# Patient Record
Sex: Male | Born: 2000
Health system: Southern US, Community
[De-identification: ages and names within clinical notes are randomized; demographics above are authoritative.]

## PROBLEM LIST (undated history)

## (undated) DIAGNOSIS — R7309 Other abnormal glucose: Secondary | ICD-10-CM

## (undated) DIAGNOSIS — E119 Type 2 diabetes mellitus without complications: Secondary | ICD-10-CM

## (undated) HISTORY — PX: APPENDECTOMY: SHX54

## (undated) HISTORY — DX: Other abnormal glucose: R73.09

---
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
## 2021-01-24 ENCOUNTER — Other Ambulatory Visit (INDEPENDENT_AMBULATORY_CARE_PROVIDER_SITE_OTHER): Payer: Self-pay | Admitting: Pediatrics

## 2021-01-26 ENCOUNTER — Encounter (INDEPENDENT_AMBULATORY_CARE_PROVIDER_SITE_OTHER): Payer: Self-pay

## 2021-01-26 DIAGNOSIS — E109 Type 1 diabetes mellitus without complications: Secondary | ICD-10-CM

## 2021-01-27 ENCOUNTER — Encounter (INDEPENDENT_AMBULATORY_CARE_PROVIDER_SITE_OTHER): Payer: Self-pay | Admitting: Pediatrics

## 2021-01-27 MED ORDER — FREESTYLE LITE TEST VI STRP: ORAL_STRIP | 1 refills | Status: AC

## 2021-01-27 MED ORDER — INSULIN LISPRO (1 UNIT DIAL) 100 UNIT/ML (KWIKPEN): PEN_INJECTOR | SUBCUTANEOUS | 1 refills | Status: DC

## 2021-01-27 MED ORDER — BASAGLAR KWIKPEN 100 UNIT/ML ~~LOC~~ SOPN: PEN_INJECTOR | SUBCUTANEOUS | 1 refills | Status: DC

## 2021-01-29 ENCOUNTER — Other Ambulatory Visit (INDEPENDENT_AMBULATORY_CARE_PROVIDER_SITE_OTHER): Payer: Self-pay | Admitting: Pediatrics

## 2021-01-29 DIAGNOSIS — E1065 Type 1 diabetes mellitus with hyperglycemia: Secondary | ICD-10-CM

## 2021-01-30 ENCOUNTER — Encounter (INDEPENDENT_AMBULATORY_CARE_PROVIDER_SITE_OTHER): Payer: Self-pay

## 2021-01-30 ENCOUNTER — Other Ambulatory Visit (INDEPENDENT_AMBULATORY_CARE_PROVIDER_SITE_OTHER): Payer: Self-pay

## 2021-01-30 DIAGNOSIS — R7309 Other abnormal glucose: Secondary | ICD-10-CM

## 2021-01-30 DIAGNOSIS — E1065 Type 1 diabetes mellitus with hyperglycemia: Secondary | ICD-10-CM

## 2021-01-30 MED ORDER — INSUPEN PEN NEEDLES 32G X 4 MM MISC: 3 refills | Status: DC

## 2021-01-30 MED ORDER — DEXCOM G6 SENSOR MISC: 1 refills | Status: DC

## 2021-01-30 NOTE — Telephone Encounter (Signed)
Mom called this morning she has sent a My chart Message and we have an E-Scribe from the pharmacy but pharmacy asked her to call and make sure we get this sent over right away. Patient is leaving to go out of town on Monday and mom needs to pick up prescription today please advise

## 2021-03-04 ENCOUNTER — Encounter (INDEPENDENT_AMBULATORY_CARE_PROVIDER_SITE_OTHER): Payer: Self-pay | Admitting: Pediatrics

## 2021-03-04 ENCOUNTER — Other Ambulatory Visit: Payer: Self-pay

## 2021-03-04 ENCOUNTER — Telehealth (INDEPENDENT_AMBULATORY_CARE_PROVIDER_SITE_OTHER): Payer: 59 | Admitting: Pediatrics

## 2021-03-04 DIAGNOSIS — Z4681 Encounter for fitting and adjustment of insulin pump: Secondary | ICD-10-CM | POA: Diagnosis not present

## 2021-03-04 DIAGNOSIS — E109 Type 1 diabetes mellitus without complications: Secondary | ICD-10-CM | POA: Diagnosis not present

## 2021-03-04 NOTE — Patient Instructions (Signed)
It was a pleasure to see you in clinic today.   Feel free to contact our office during normal business hours at [phone] with questions or concerns. If you need us urgently after normal business hours, please call the above number to reach our answering service who will contact the on-call pediatric endocrinologist.  If you choose to communicate with us via MyChart, please do not send urgent messages as this inbox is NOT monitored on nights or weekends.  Urgent concerns should be discussed with the on-call pediatric endocrinologist.  -Always have fast sugar with you in case of low blood sugar (glucose tabs, regular juice or soda, candy) -Always wear your ID that states you have diabetes -Always bring your meter/continuous glucose monitor to your visit -Call/Email if you want to review blood sugars   At [facility], we are committed to providing exceptional care. You will receive a patient satisfaction survey through text or email regarding your visit today. Your opinion is important to me. Comments are appreciated.  

## 2021-03-04 NOTE — Progress Notes (Signed)
This is a [facility] E-Visit follow up consult provided via [facility]  and his mother consented to an E-Visit consult today.  Location of patient: Luke [name] is at home Location of provider: Lanelle Bal is at office Patient was referred by Naida Sleight, MD   The following participants were involved in this E-Visit: Luke [name]- patient, Jerelene Redden, MD- provider- Eustace Moore, CCMA, Jaece's mother  This visit was done via [employer] Complain/ Reason for E-Visit today: follow up T1DM Total time on call: 30 minutes (majority of visit conducted via video however video froze so last 10 minutes conducted via phone Follow up: 3 months   Pediatric Endocrinology Consultation Follow-up Visit  Chief Complaint: Type 1 diabetes  HPI: Luke [name] is a 20 y.o. male presenting for follow-up of the above concerns.  he is accompanied to this visit by his mother.   THIS IS A TELEHEALTH VIDEO VISIT.   1. Tennis was initially referred to [facility] in 05/2015 for concerns of new onset diabetes.  He was seen by his PCP on 06/10/2015 for a well child check.  At that visit, blood work was obtained due to him being overweight including a random blood sugar that was elevated to 297 (this was obtained 3-4 hours after eating a cheeseburger, potato wedges, apple slices, chocolate milk, a can of pineapple soda, and a cupcake with frosting).  A lipid panel was also obtained 06/10/15 showing total cholesterol 199, HDL 31, LDL 109.  He was called back to PCP's office on 06/11/2015 for fasting blood work (fingerstick glucose 155, 146 on CMP), UA (negative for ketones and glucose), A1c 7.7%.  CMP normal except elevated glucose (including normal BUN /Cr and AST/ALT).  TSH slightly elevated at 5.349 (0.4-5) with normal FT4 of 0.93.  He was initially seen at [facility] on 06/13/15 where A1c was 7.4%, GAD Ab and insulin Ab were positive and C-peptide was normal and he was diagnosed with evolving  T1DM. He was started on metformin at that visit prior to Ab results.  Metformin was discontinued in 07/2015 after several blood sugars were in the 60s.  He also had normal TFTs and celiac screen in 07/2015.  A1c gradually increased and blood sugars started becoming elevated so he was started on rapid-acting insulin in 10/2016 and started on long-acting insulin within the next week.  He started on an omnipod pump in 02/2017.  He started on a dexcom CGM in 07/2018.  2. Since last visit on 11/06/20, he has been well.   ED visits/Hospitalizations for diabetes: None  Concerns:  Went to [place] and [place], BGs low due to lots of walking.  Has been back x 2 days though not feeling well so visit today is virtual.  Insulin regimen: Humalog in omnipod pump Basal Rates:  12AM 1.55  8AM 1.3  12PM 1.1  2PM 1.05  6PM 1.4  Total: 32.4 units daily Confirmed basal rates with patient.   Insulin to carbohydrate ratio 12AM 7  6AM 5  10AM 6  2PM 7    Insulin Sensitivity Factor 12AM 30                        Target Blood Glucose 12AM 140  6:30AM 115  9PM 140            Active insulin time 3 hours  BG/Pump download:  Unable to download given virtual visit   CGM download:  Hypoglycemia: able to feel lows. Many while walking. No glucagon needed recently.  Wearing Med-alert ID currently: Not discussed today Injection sites: abdominal wall and arm(s).No problems with site infections Annual labs due: 04/2021 Ophthalmology due: last eye exam 12/2020; no concerns per mom.  ROS: All systems reviewed with pertinent positives listed below; otherwise negative. Video visit as he is not feeling well, feels BGs elevated also due to illness    Past Medical History:  Past Medical History:  Diagnosis Date   Diabetes mellitus without complication ([place])    Elevated hemoglobin A1c    05/2015 A1c 7.7% with fasting hyperglycemia.  + GAD ab and + insulin Ab    Meds: Current Outpatient  Medications on File Prior to Visit  Medication Sig Dispense Refill   Continuous Blood Gluc Sensor (DEXCOM G6 SENSOR) MISC Change every 10 days as directed. 9 each 1   Continuous Blood Gluc Transmit (DEXCOM G6 TRANSMITTER) MISC USE AS DIRECTED 1 each 3   insulin lispro (HUMALOG) 100 UNIT/ML injection 200 UNITS OF INSULIN IN PUMP EVERY 48 HOURS 40 mL 5   Glucagon (BAQSIMI TWO PACK) 3 MG/DOSE POWD Place 1 application into the nose as needed. Use as directed if unconscious, unable to take food po, or having a seizure due to hypoglycemia (Patient not taking: Reported on 03/04/2021) 2 each 1   glucagon 1 MG injection Use for Severe Hypoglycemia . Inject 96m intramuscularly if unresponsive, unable to swallow, unconscious and/or has seizure (Patient not taking: Reported on 11/06/2020) 1 kit 2   glucose blood (FREESTYLE LITE) test strip Use to check blood sugar 6x per day 600 strip 1   Insulin Glargine (BASAGLAR KWIKPEN) 100 UNIT/ML Inject up to 50 units per day 45 mL 1   insulin lispro (HUMALOG KWIKPEN) 100 UNIT/ML KwikPen Inject up to 50 units per day 45 mL 1   Insulin Pen Needle (INSUPEN PEN NEEDLES) 32G X 4 MM MISC Inject insulin via insulin pen 6 x daily 200 each 3   triamcinolone (KENALOG) 0.025 % cream Apply 1 application topically 2 (two) times daily. (Patient not taking: No sig reported) 30 g 0   No current facility-administered medications on file prior to visit.   Allergies: No Known Allergies  Surgical History: Past Surgical History:  Procedure Laterality Date   APPENDECTOMY     LAPAROSCOPIC APPENDECTOMY N/A 12/16/2013   Procedure: APPENDECTOMY LAPAROSCOPIC;  Surgeon: MJerilynn Mages SGerald Stabs MD;  Location[facility]  Service: Pediatrics;  Laterality: N/A;   Family History:  Family History  Problem Relation Age of Onset   Kidney disease Paternal Grandmother    Kidney disease Paternal Grandfather    Hypertension Maternal Grandmother    Thyroid disease Maternal Grandfather    Healthy Mother     No strong family history of T2DM; only known family member with T2DM is paternal great grandmother.  No family history of T1DM.  MGF has hyperthyroidism, treated with oral medication (has never had surgery).  No other autoimmune diseases in the family.  Social History: Lives with: parents.  He is an only child Graduated from [employer]  [facility] [employer] [facility] [employer].    Physical Exam:  There were no vitals filed for this visit.  Body mass index: body mass index is unknown because there is no height or weight on file. Blood pressure percentiles are not available for patients who are 18 years or older.   Wt Readings from Last 3 Encounters:  11/06/20 203 lb (92.1 kg) (  95 %, Z= 1.61)*  08/05/20 191 lb (86.6 kg) (91 %, Z= 1.36)*  05/07/20 195 lb 1.7 oz (88.5 kg) (93 %, Z= 1.49)*   * Growth percentiles are based on CDC (Boys, 2-20 Years) data.   Ht Readings from Last 3 Encounters:  08/05/20 5' 10.47" (1.79 m) (65 %, Z= 0.39)*  04/30/20 5' 9.92" (1.776 m) (58 %, Z= 0.21)*  11/14/19 5' 9.49" (1.765 m) (54 %, Z= 0.11)*   * Growth percentiles are based on CDC (Boys, 2-20 Years) data.   There is no height or weight on file to calculate BMI.  No weight on file for this encounter. No height on file for this encounter.   Exam limited due to video visit.  Alert, well appearing, answers questions appropriately  Laboratory Evaluation:  Last A1c 5.1% in 10/2015--> 5.4% in 12/2015-->5.7%-->6.0%-->6.8% 10/28/16-->8.2% 01/2017-->7.8% in 03/2017-->7.6% 08/2017-->8.8% in 11/2017-->8.6% 02/2018-->9.2% 06/2018--> 8.4% 09/2018--> 7.8% 03/2019--> 8.4% 07/2019--> 8.6% 10/2019--> 8% 03/2020--> 8.6% 07/2020--> 7.4% 10/2020  03/2016 TFTs: TSH 3.654, FT4 0.9.  Thyroglobulin Ab at 1.3 (<1) and TPO Ab 71 (<9).    Assessment/Plan: Tate Zagal is a 20 y.o. male with T1DM on a pump (omnipod original) and CGM regimen.   Unable to do A1c due to virtual visit, though Dexcom tracing shows he is not  meeting goal of TIR >70%. he needs more insulin via basal throughout the day.    When a patient is on insulin, intensive monitoring of blood glucose levels and continuous insulin titration is vital to avoid insulin toxicity leading to severe hypoglycemia. Severe hypoglycemia can lead to seizure or death. Hyperglycemia can also result from inadequate insulin dosing and can lead to ketosis requiring ICU admission and intravenous insulin.   1. Type 1 without complications ([place]) -Encouraged to wear med alert ID every day, esp when at college -Provided with my contact information and advised to email/send mychart with questions/need for BG review -CGM download reviewed extensively (see interpretation above) -Discussed omnipod 5; he is interested.  Advised to go to [employer] and request upgrade to omnipod 5.  I will route his information to our DM educator and nurse as well so they can follow-up on this.  2. Insulin pump titration -Made the following pump changes: Basal Rates:  12AM 1.55  8AM 1.3  12PM 1.1-->1.2  2PM 1.05-->1.15  6PM 1.4  Total: 32.4 units daily   Insulin to carbohydrate ratio 12AM 7  6AM 5  10AM 6  2PM 7    Insulin Sensitivity Factor 12AM 30                        Target Blood Glucose 12AM 140  6:30AM 115  9PM 140            Active insulin time 3 hours  -He would greatly benefit from closed loop pump for fine-tuning. -Discussed DM and college including roommate knowing how to administer glucagon, alcohol and diabetes.  Follow-up:   Return in about 3 months (around 06/04/2021).   >40 minutes spent today reviewing the medical chart, counseling the patient/family, and documenting today's encounter.   [name], MD

## 2021-03-26 ENCOUNTER — Encounter (INDEPENDENT_AMBULATORY_CARE_PROVIDER_SITE_OTHER): Payer: Self-pay

## 2021-04-01 ENCOUNTER — Encounter (INDEPENDENT_AMBULATORY_CARE_PROVIDER_SITE_OTHER): Payer: Self-pay | Admitting: Pediatrics

## 2021-04-27 ENCOUNTER — Encounter (INDEPENDENT_AMBULATORY_CARE_PROVIDER_SITE_OTHER): Payer: Self-pay

## 2021-04-27 DIAGNOSIS — E1065 Type 1 diabetes mellitus with hyperglycemia: Secondary | ICD-10-CM

## 2021-04-27 MED ORDER — DEXCOM G6 TRANSMITTER MISC: 1 refills | Status: DC

## 2021-05-01 ENCOUNTER — Encounter (INDEPENDENT_AMBULATORY_CARE_PROVIDER_SITE_OTHER): Payer: Self-pay

## 2021-05-18 ENCOUNTER — Encounter (INDEPENDENT_AMBULATORY_CARE_PROVIDER_SITE_OTHER): Payer: Self-pay

## 2021-07-16 ENCOUNTER — Other Ambulatory Visit (INDEPENDENT_AMBULATORY_CARE_PROVIDER_SITE_OTHER): Payer: Self-pay | Admitting: Pediatrics

## 2021-07-16 DIAGNOSIS — E1065 Type 1 diabetes mellitus with hyperglycemia: Secondary | ICD-10-CM

## 2021-07-28 ENCOUNTER — Telehealth (INDEPENDENT_AMBULATORY_CARE_PROVIDER_SITE_OTHER): Payer: Self-pay

## 2021-07-28 NOTE — Telephone Encounter (Signed)
Per covermymeds, initiated PA for Dexcom Sensor thru covermymeds:

## 2021-09-04 ENCOUNTER — Other Ambulatory Visit: Payer: Self-pay

## 2021-09-04 ENCOUNTER — Ambulatory Visit (HOSPITAL_COMMUNITY)
Admission: RE | Admit: 2021-09-04 | Discharge: 2021-09-04 | Disposition: A | Discharge status: Home / Self Care | Payer: 59 | Source: Ambulatory Visit | Attending: Emergency Medicine | Admitting: Emergency Medicine

## 2021-09-04 VITALS — BP 146/83 | HR 78 | Temp 98.3°F | Resp 16

## 2021-09-04 DIAGNOSIS — H938X1 Other specified disorders of right ear: Secondary | ICD-10-CM

## 2021-09-04 MED ORDER — NEOMYCIN-POLYMYXIN-HC 3.5-10000-1 OT SUSP: 4.0000 [drp] | Freq: Three times a day (TID) | OTIC | 0 refills | Status: DC

## 2021-09-04 NOTE — Discharge Instructions (Addendum)
Place 4 drops into right ear three times a day for 7 days  If no improvement seen in 72 hours please return for evaluation  Refrain from placing objects into ear until symptoms have resolved   May use over the counter flonase nasal spray to help remove secretions and minimize sensation of fullness

## 2021-09-04 NOTE — ED Provider Notes (Signed)
[place]    CSN: [id number] Arrival date & time: 09/04/21  1747      History   Chief Complaint Chief Complaint  Patient presents with   Otalgia    HPI Luke [name] is a 21 y.o. male.   Patient presents with right-sided ear pain and sensation of fullness for 4 days.  After sleeping directly on right side.  Has begun to have muffled hearing as well.  Has attempted use of ear wax removal kit and irrigation with water at home which was not effective.  Denies fever, chills, drainage, ear itching, URI symptoms.    Past Medical History:  Diagnosis Date   Diabetes mellitus without complication (HCC)    Elevated hemoglobin A1c    05/2015 A1c 7.7% with fasting hyperglycemia.  + GAD ab and + insulin Ab    Patient Active Problem List   Diagnosis Date Noted   Need for vaccination 81/05/3158   DM w/o complication type I, uncontrolled 09/05/2017   Encounter for immunization 09/05/2017   Abnormal endocrine laboratory test finding 09/05/2017   Elevated hemoglobin A1c 07/02/2015   Systemic inflammatory response syndrome due to infection 12/17/2013   Acute cecitis with rupture of appendix 12/16/2013    Past Surgical History:  Procedure Laterality Date   APPENDECTOMY     LAPAROSCOPIC APPENDECTOMY N/A 12/16/2013   Procedure: APPENDECTOMY LAPAROSCOPIC;  Surgeon: Jerilynn Mages. Gerald Stabs, MD;  Location: [facility];  Service: Pediatrics;  Laterality: N/A;       Home Medications    Prior to Admission medications   Medication Sig Start Date End Date Taking? Authorizing Provider  Continuous Blood Gluc Sensor (DEXCOM G6 SENSOR) MISC Change every 10 days as directed. 01/30/21   [name], MD  Continuous Blood Gluc Transmit (DEXCOM G6 TRANSMITTER) MISC Use with Dexcom sensor, reuse for 3 months 04/27/21   [name], MD  Glucagon (BAQSIMI TWO PACK) 3 MG/DOSE POWD Place 1 application into the nose as needed. Use as directed if unconscious, unable to take food po, or  having a seizure due to hypoglycemia Patient not taking: Reported on 03/04/2021 04/30/20   [name], MD  glucagon 1 MG injection Use for Severe Hypoglycemia . Inject 33m intramuscularly if unresponsive, unable to swallow, unconscious and/or has seizure Patient not taking: Reported on 11/06/2020 12/08/17   JLevon Hedger MD  glucose blood (FREESTYLE LITE) test strip Use to check blood sugar 6x per day 01/27/21   JLevon Hedger MD  Insulin Glargine ([facility] 100 UNIT/ML Inject up to 50 units per day 01/27/21   JLevon Hedger MD  insulin lispro (HUMALOG KWIKPEN) 100 UNIT/ML KwikPen Inject up to 50 units per day 01/27/21   JLevon Hedger MD  insulin lispro (HUMALOG) 100 UNIT/ML injection 200 UNITS OF INSULIN IN PUMP EVERY 48 HOURS 07/16/21   JLevon Hedger MD  Insulin Pen Needle (INSUPEN PEN NEEDLES) 32G X 4 MM MISC Inject insulin via insulin pen 6 x daily 01/30/21   JLevon Hedger MD  triamcinolone (KENALOG) 0.025 % cream Apply 1 application topically 2 (two) times daily. Patient not taking: No sig reported 02/26/20   MEnrique Sack FNP    Family History Family History  Problem Relation Age of Onset   Kidney disease Paternal Grandmother    Kidney disease Paternal Grandfather    Hypertension Maternal Grandmother    Thyroid disease Maternal Grandfather    Healthy Mother     Social History Social History  Tobacco Use   Smoking status: Never   Smokeless tobacco: Never  Substance Use Topics   Alcohol use: No   Drug use: No     Allergies   Patient has no known allergies.   Review of Systems Review of Systems  Constitutional: Negative.   HENT:  Positive for ear pain. Negative for congestion, dental problem, drooling, ear discharge, facial swelling, hearing loss, mouth sores, nosebleeds, postnasal drip, rhinorrhea, sinus pressure, sinus pain, sneezing, sore throat, tinnitus, trouble swallowing and voice  change.   Respiratory: Negative.    Cardiovascular: Negative.   Skin: Negative.   Neurological: Negative.     Physical Exam Triage Vital Signs ED Triage Vitals [09/04/21 1803]  Enc Vitals Group     BP (!) 146/83     Pulse Rate 78     Resp 16     Temp 98.3 F (36.8 C)     Temp Source Oral     SpO2 100 %     Weight      Height      Head Circumference      Peak Flow      Pain Score      Pain Loc      Pain Edu?      Excl. in [place]?    No data found.  Updated Vital Signs BP (!) 146/83 (BP Location: Left Arm)    Pulse 78    Temp 98.3 F (36.8 C) (Oral)    Resp 16    SpO2 100%   Visual Acuity Right Eye Distance:   Left Eye Distance:   Bilateral Distance:    Right Eye Near:   Left Eye Near:    Bilateral Near:     Physical Exam Constitutional:      Appearance: Normal appearance. He is normal weight.  HENT:     Head: Normocephalic.     Right Ear: Tympanic membrane and external ear normal.     Left Ear: Tympanic membrane, ear canal and external ear normal.     Ears:     Comments: Hailie Searight to yellowish drainage noted in the right ear canal, no involvement of the tympanic membrane or external ear Eyes:     Extraocular Movements: Extraocular movements intact.  Pulmonary:     Effort: Pulmonary effort is normal.  Skin:    General: Skin is warm and dry.  Neurological:     Mental Status: He is alert and oriented to person, place, and time. Mental status is at baseline.  Psychiatric:        Mood and Affect: Mood normal.        Behavior: Behavior normal.     UC Treatments / Results  Labs (all labs ordered are listed, but only abnormal results are displayed) Labs Reviewed - No data to display  EKG   Radiology No results found.  Procedures Procedures (including critical care time)  Medications Ordered in UC Medications - No data to display  Initial Impression / Assessment and Plan / UC Course  I have reviewed the triage vital signs and the nursing  notes.  Pertinent labs & imaging results that were available during my care of the patient were reviewed by me and considered in my medical decision making (see chart for details).  Ear fullness, right  Attempted ear irrigation in office with no success, ear reevaluated by Dr. Windy Carina, prescribed Cortisporin otic suspension, 4 drops 3 times a day for 7 days, recommended reevaluation if symptoms have not improved  in the 72-hour period, advise discontinuation of further irrigation or object placement into ear canal until symptoms have resolved, may attempt use of over-the-counter Flonase to further help clear sinuses Final Clinical Impressions(s) / UC Diagnoses   Final diagnoses:  None   Discharge Instructions   None    ED Prescriptions   None    PDMP not reviewed this encounter.   Hans Eden, NP 09/07/21 1325

## 2021-09-24 ENCOUNTER — Telehealth (INDEPENDENT_AMBULATORY_CARE_PROVIDER_SITE_OTHER): Payer: Self-pay | Admitting: Pharmacist

## 2021-09-24 ENCOUNTER — Other Ambulatory Visit (HOSPITAL_COMMUNITY): Payer: Self-pay

## 2021-09-24 ENCOUNTER — Ambulatory Visit (INDEPENDENT_AMBULATORY_CARE_PROVIDER_SITE_OTHER): Payer: 59 | Admitting: Pediatrics

## 2021-09-24 ENCOUNTER — Other Ambulatory Visit: Payer: Self-pay

## 2021-09-24 ENCOUNTER — Encounter (INDEPENDENT_AMBULATORY_CARE_PROVIDER_SITE_OTHER): Payer: Self-pay | Admitting: Pediatrics

## 2021-09-24 ENCOUNTER — Telehealth (INDEPENDENT_AMBULATORY_CARE_PROVIDER_SITE_OTHER): Payer: Self-pay

## 2021-09-24 VITALS — BP 128/80 | HR 76 | Wt 218.4 lb

## 2021-09-24 DIAGNOSIS — E1065 Type 1 diabetes mellitus with hyperglycemia: Secondary | ICD-10-CM | POA: Diagnosis not present

## 2021-09-24 DIAGNOSIS — Z4681 Encounter for fitting and adjustment of insulin pump: Secondary | ICD-10-CM

## 2021-09-24 DIAGNOSIS — E10649 Type 1 diabetes mellitus with hypoglycemia without coma: Secondary | ICD-10-CM

## 2021-09-24 LAB — POCT GLYCOSYLATED HEMOGLOBIN (HGB A1C): HbA1c POC (<> result, manual entry): 7.8 % (ref 4.0–5.6)

## 2021-09-24 LAB — POCT GLUCOSE (DEVICE FOR HOME USE): Glucose Fasting, POC: 231 mg/dL — AB (ref 70–99)

## 2021-09-24 MED ORDER — INSULIN LISPRO 100 UNIT/ML IJ SOLN: INTRAMUSCULAR | 5 refills | Status: DC

## 2021-09-24 NOTE — Patient Instructions (Addendum)
It was a pleasure to see you in clinic today.   Feel free to contact our office during normal business hours at [phone] with questions or concerns. If you need Luke [name] urgently after normal business hours, please call the above number to reach our answering service who will contact the on-call pediatric endocrinologist.  If you choose to communicate with Luke [name] via [place], please do not send urgent messages as this inbox is NOT monitored on nights or weekends.  Urgent concerns should be discussed with the on-call pediatric endocrinologist.  -Always have fast sugar with you in case of low blood sugar (glucose tabs, regular juice or soda, candy) -Always wear your ID that states you have diabetes -Always bring your meter/continuous glucose monitor to your visit -Call/Email if you want to review blood sugars  Please go to the following address to have labs drawn after today's visit: [address] [place], [place] [address]  Or   [address], [address]  Basaglar dose is 33 units every 24 hours Humalog carbs = total number of carbs divided by 6 Humalog correction= (Blood sugar-150) divided by 30

## 2021-09-24 NOTE — Telephone Encounter (Signed)
Please contact family to discuss   Omnipod 5 G6 Intro Kit (1 kit, 30 day supply; no prior authorization required): $35 for 30 day supply   Omnipod 5 G6 Pods (3 boxes (each box has 5 pods; no prior authorization required), 30 day supply): $35 for 30 day supply   Please discuss . [id number]  Available Pump Date Classes: -10/01/21 8:30 am - 11:30 am -10/08/21 8:30 am - 11:30 am -10/22/21 8:30 am - 11:30 am  Thank you for involving clinical pharmacist/diabetes educator to assist in providing this patient's care.   Drexel Iha, PharmD, BCACP, [place], CPP

## 2021-09-24 NOTE — Telephone Encounter (Signed)
Please run benefits investigation for Omnipod 5 device. This is not a specialty medication and can be filled at the local pharmacy. °  °Omnipod 5 G6 Intro Kit (1 kit, 30 day supply), NDC [address]-01 °  °Omnipod 5 G6 Pods (3 boxes (each box has 5 pods), 30 day supply), NDC [address]-21 °  °Can you also please let me know °1) if PA is required? °  °Thank you for involving clinical pharmacist/diabetes educator to assist in providing this patient's care.  °  °Ferrah Panagopoulos, PharmD, BCACP, CDCES, CPP ° °

## 2021-09-24 NOTE — Progress Notes (Addendum)
Pediatric Endocrinology Consultation Follow-up Visit  Chief Complaint: Type 1 diabetes  HPI: Luke [name] is a 21 y.o. male presenting for follow-up of the above concerns.  he is accompanied to this visit by his mother.      1. Luke [name] was initially referred to [facility] in 05/2015 for concerns of new onset diabetes.  He was seen by his PCP on 06/10/2015 for a well child check.  At that visit, blood work was obtained due to him being overweight including a random blood sugar that was elevated to 297 (this was obtained 3-4 hours after eating a cheeseburger, potato wedges, apple slices, chocolate milk, a can of pineapple soda, and a cupcake with frosting).  A lipid panel was also obtained 06/10/15 showing total cholesterol 199, HDL 31, LDL 109.  He was called back to PCP's office on 06/11/2015 for fasting blood work (fingerstick glucose 155, 146 on CMP), UA (negative for ketones and glucose), A1c 7.7%.  CMP normal except elevated glucose (including normal BUN /Cr and AST/ALT).  TSH slightly elevated at 5.349 (0.4-5) with normal FT4 of 0.93.  He was initially seen at [facility] on 06/13/15 where A1c was 7.4%, GAD Ab and insulin Ab were positive and C-peptide was normal and he was diagnosed with evolving T1DM. He was started on metformin at that visit prior to Ab results.  Metformin was discontinued in 07/2015 after several blood sugars were in the 60s.  He also had normal TFTs and celiac screen in 07/2015.  A1c gradually increased and blood sugars started becoming elevated so he was started on rapid-acting insulin in 10/2016 and started on long-acting insulin within the next week.  He started on an omnipod pump in 02/2017.  He started on a dexcom CGM in 07/2018.  2. Since last visit on 03/04/21 (video visit), he has been well.   ED visits/Hospitalizations for diabetes: None  Concerns:  Wants omnipod 5 though insurance did not cover it when we tried in the past.   Needs name of adult ophthalmologist Planning to  study abroad Jan 2024  Insulin regimen: Humalog in omnipod pump Basal Rates:  12AM 1.55  8AM 1.3  12PM 1.2  2PM 1.15  6PM 1.4  Total: 33 units daily   Insulin to carbohydrate ratio 12AM 7  6AM 5  10AM 6  2PM 7    Insulin Sensitivity Factor 12AM 30                        Target Blood Glucose 12AM 140  6:30AM 115  9AM 140            Active insulin time 3 hours   BG/Pump download:     CGM download:      Interpretation: Needs more basal throughout the day and less at night   Hypoglycemia: Does have in middle of the night lows.  Able to feel lows.  No glucagon needed recently.  Wearing Med-alert ID currently: Not currently.  Injection sites: abdominal wall and arm(s), no site infections Annual labs due: 04/2021 Ophthalmology due: last eye exam 12/2020; no concerns per mom.  Needs adult ophthalmologist.  ROS: All systems reviewed with pertinent positives listed below; otherwise negative. Constitutional: Weight has increased 15lb since last visit.       Past Medical History:  Past Medical History:  Diagnosis Date   Diabetes mellitus without complication ([place])    Elevated hemoglobin A1c    05/2015 A1c 7.7% with fasting hyperglycemia.  + GAD  ab and + insulin Ab    Meds: Current Outpatient Medications on File Prior to Visit  Medication Sig Dispense Refill   Continuous Blood Gluc Sensor (DEXCOM G6 SENSOR) MISC Change every 10 days as directed. 9 each 1   Continuous Blood Gluc Transmit (DEXCOM G6 TRANSMITTER) MISC Use with Dexcom sensor, reuse for 3 months 1 each 1   glucose blood (FREESTYLE LITE) test strip Use to check blood sugar 6x per day 600 strip 1   insulin lispro (HUMALOG KWIKPEN) 100 UNIT/ML KwikPen Inject up to 50 units per day 45 mL 1   insulin lispro (HUMALOG) 100 UNIT/ML injection 200 UNITS OF INSULIN IN PUMP EVERY 48 HOURS 40 mL 5   Insulin Pen Needle (INSUPEN PEN NEEDLES) 32G X 4 MM MISC Inject insulin via insulin pen 6 x daily 200 each 3    Glucagon (BAQSIMI TWO PACK) 3 MG/DOSE POWD Place 1 application into the nose as needed. Use as directed if unconscious, unable to take food po, or having a seizure due to hypoglycemia (Patient not taking: Reported on 03/04/2021) 2 each 1   glucagon 1 MG injection Use for Severe Hypoglycemia . Inject 62m intramuscularly if unresponsive, unable to swallow, unconscious and/or has seizure (Patient not taking: Reported on 11/06/2020) 1 kit 2   Insulin Glargine (BASAGLAR KWIKPEN) 100 UNIT/ML Inject up to 50 units per day (Patient not taking: Reported on 09/24/2021) 45 mL 1   No current facility-administered medications on file prior to visit.   Allergies: No Known Allergies  Surgical History: Past Surgical History:  Procedure Laterality Date   APPENDECTOMY     LAPAROSCOPIC APPENDECTOMY N/A 12/16/2013   Procedure: APPENDECTOMY LAPAROSCOPIC;  Surgeon: MJerilynn Mages SGerald Stabs MD;  [facility]  Service: Pediatrics;  Laterality: N/A;   Family History:  Family History  Problem Relation Age of Onset   Kidney disease Paternal Grandmother    Kidney disease Paternal Grandfather    Hypertension Maternal Grandmother    Thyroid disease Maternal Grandfather    Healthy Mother    No strong family history of T2DM; only known family member with T2DM is paternal great grandmother.  No family history of T1DM.  MGF has hyperthyroidism, treated with oral medication (has never had surgery).  No other autoimmune diseases in the family.  Social History: Lives with: parents.  He is an only child Attending [employer]  Physical Exam:  Vitals:   09/24/21 0911  BP: 128/80  Pulse: 76  Weight: 218 lb 6.4 oz (99.1 kg)    Body mass index: body mass index is 30.92 kg/m. Blood pressure percentiles are not available for patients who are 18 years or older.   Wt Readings from Last 3 Encounters:  09/24/21 218 lb 6.4 oz (99.1 kg) (97 %, Z= 1.86)*  11/06/20 203 lb (92.1 kg) (95 %, Z= 1.61)*  08/05/20 191 lb  (86.6 kg) (91 %, Z= 1.36)*   * Growth percentiles are based on CDC (Boys, 2-20 Years) data.   Ht Readings from Last 3 Encounters:  08/05/20 5' 10.47" (1.79 m) (65 %, Z= 0.39)*  04/30/20 5' 9.92" (1.776 m) (58 %, Z= 0.21)*  11/14/19 5' 9.49" (1.765 m) (54 %, Z= 0.11)*   * Growth percentiles are based on CDC (Boys, 2-20 Years) data.   Body mass index is 30.92 kg/m.  97 %ile (Z= 1.86) based on CDC (Boys, 2-20 Years) weight-for-age data using vitals from 09/24/2021. No height on file for this encounter.   General: Well developed,  well nourished male in no acute distress.  Appears stated age Head: Normocephalic, atraumatic.   Eyes:  Pupils equal and round. EOMI.   Sclera white.  No eye drainage.   Ears/Nose/Mouth/Throat: Masked Neck: supple, no cervical lymphadenopathy, no thyromegaly Cardiovascular: regular rate, normal S1/S2, no murmurs Respiratory: No increased work of breathing.  Lungs clear to auscultation bilaterally.  No wheezes. Abdomen: soft, nontender, nondistended.  Extremities: warm, well perfused, cap refill < 2 sec.   Musculoskeletal: Normal muscle mass.  Normal strength Skin: warm, dry.  No rash or lesions. Skin normal at injection sites Neurologic: alert and oriented, normal speech, no tremor   Laboratory Evaluation:  Last A1c 5.1% in 10/2015--> 5.4% in 12/2015-->5.7%-->6.0%-->6.8% 10/28/16-->8.2% 01/2017-->7.8% in 03/2017-->7.6% 08/2017-->8.8% in 11/2017-->8.6% 02/2018-->9.2% 06/2018--> 8.4% 09/2018--> 7.8% 03/2019--> 8.4% 07/2019--> 8.6% 10/2019--> 8% 03/2020--> 8.6% 07/2020--> 7.4% 10/2020--> 7.8% 09/24/21  03/2016 TFTs: TSH 3.654, FT4 0.9.  Thyroglobulin Ab at 1.3 (<1) and TPO Ab 71 (<9).    Assessment/Plan: Luke [name] is a 21 y.o. male with T1DM on a pump (omnipod eros) and CGM regimen.   A1c is slightly higher than last visit and is above the ADA goal of <7.0%.  Dexcom tracing shows he is not meeting goal of TIR >70%. he needs more insulin via basal throughout the day.     When a patient is on insulin, intensive monitoring of blood glucose levels and continuous insulin titration is vital to avoid insulin toxicity leading to severe hypoglycemia. Severe hypoglycemia can lead to seizure or death. Hyperglycemia can also result from inadequate insulin dosing and can lead to ketosis requiring ICU admission and intravenous insulin.   1. Type 1 diabetes with hyperglycemia 2. Hypoglycemia due to Type 1 diabetes - POCT Glucose and POCT HgB A1C as above -Will draw annual diabetes labs today (lipid panel, TSH, FT4, urine microalbumin to creatinine ratio) -Encouraged to wear med alert ID every day -Encouraged to rotate injection sites -Provided with my contact information and advised to email/send mychart with questions/need for BG review -CGM download reviewed extensively (see interpretation above) -School plan completed -Rx sent to pharmacy include: humalog vials (using 110 units daily) -reviewed doses in case of pump failure Basaglar dose is 33 units every 24 hours Humalog carbs = total number of carbs divided by 6 Humalog correction= (Blood sugar-150) divided by 30  Sent info via mychart for ophthalmology for mom to call to schedule  3. Insulin pump titration -Made the following pump changes: Basal Rates:  12AM 1.55-->1.45  8AM 1.3-->1.4  12PM 1.2-->1.35  2PM 1.15  6PM 1.4-->1.45  Total: 33 units daily-->33.2   Insulin to carbohydrate ratio 12AM 7  6AM 5  10AM 6  2PM 7    Insulin Sensitivity Factor 12AM 30                        Target Blood Glucose 12AM 140  6:30AM 115  9AM-->9PM 140            Active insulin time 3 hours    4. Counseling for insulin pump Explained omnipod 5.  Will see if insurance will cover it. Explained that he will need a cell phone for dexcom.   Follow-up:   Return in about 3 months (around 12/23/2021).   >40 minutes spent today reviewing the medical chart, counseling the patient/family, and documenting today's  encounter.   [name], MD  -------------------------------- 09/29/21 10:29 AM ADDENDUM:  Results for orders placed or performed in visit  on 09/24/21  Lipid panel  Result Value Ref Range   Cholesterol 210 (H) <170 mg/dL   HDL 36 (L) >45 mg/dL   Triglycerides 135 (H) <90 mg/dL   LDL Cholesterol (Calc) 148 (H) <110 mg/dL (calc)   Total CHOL/HDL Ratio 5.8 (H) <5.0 (calc)   Non-HDL Cholesterol (Calc) 174 (H) <120 mg/dL (calc)  T4, free  Result Value Ref Range   Free T4 1.2 0.8 - 1.4 ng/dL  TSH  Result Value Ref Range   TSH 3.70 0.50 - 4.30 mIU/L  Microalbumin / creatinine urine ratio  Result Value Ref Range   Creatinine, Urine 85 20 - 320 mg/dL   Microalb, Ur 0.6 mg/dL   Microalb Creat Ratio 7 <30 mcg/mg creat  POCT Glucose (Device for Home Use)  Result Value Ref Range   Glucose Fasting, POC 231 (A) 70 - 99 mg/dL   POC Glucose    POCT glycosylated hemoglobin (Hb A1C)  Result Value Ref Range   Hemoglobin A1C     HbA1c POC (<> result, manual entry) 7.8 4.0 - 5.6 %   HbA1c, POC (prediabetic range)     HbA1c, POC (controlled diabetic range)     Sent the following mychart message:  Hi Adilson, Your thyroid labs look good and your urine does not have too much protein.   Your cholesterol has worsened since last check.  I recommend cutting back on fried foods and fast food, eating more fruits and veggies, and getting as much exercise as you can.  We can repeat your cholesterol level again in 6 months to see if it has improved.  If it has worsened, we may need to consider starting a medicine to treat it.  Please let me know if you have questions!

## 2021-09-24 NOTE — Telephone Encounter (Signed)
Per Dr Charna Archer, PA initiated for Omnipod 5 Intro Kit and Pods:  Kit:    Pods:

## 2021-09-25 LAB — LIPID PANEL
Cholesterol: 210 mg/dL — ABNORMAL HIGH (ref <170)
HDL: 36 mg/dL — ABNORMAL LOW (ref >45)
LDL Cholesterol (Calc): 148 mg/dL (calc) — ABNORMAL HIGH (ref <110)
Non-HDL Cholesterol (Calc): 174 mg/dL (calc) — ABNORMAL HIGH (ref <120)
Total CHOL/HDL Ratio: 5.8 (calc) — ABNORMAL HIGH (ref <5.0)
Triglycerides: 135 mg/dL — ABNORMAL HIGH (ref <90)

## 2021-09-25 LAB — MICROALBUMIN / CREATININE URINE RATIO
Creatinine, Urine: 85 mg/dL (ref 20–320)
Microalb Creat Ratio: 7 mcg/mg creat (ref <30)
Microalb, Ur: 0.6 mg/dL

## 2021-09-25 LAB — TSH: TSH: 3.7 mIU/L (ref 0.50–4.30)

## 2021-09-25 LAB — T4, FREE: Free T4: 1.2 ng/dL (ref 0.8–1.4)

## 2021-09-25 NOTE — Telephone Encounter (Signed)
Called patient and/or guardian on 09/25/2021 at 1:19 PM to discuss instructions prior to Omnipod 5 training appointment  Omnipod 5 G6 Intro Kit (1 kit, 30 day supply; no prior authorization required): $35 for 30 day supply    Omnipod 5 G6 Pods (3 boxes (each box has 5 pods; no prior authorization required), 30 day supply): $35 for 30 day supply     I spoke with pt, Patient is agreeable to start Omnipod with this listed co-pays.   He stated that he will not be in town again until April, because of college and wont be able to start until then. He wanted to know if this was ok.  I told Luke [name] that we probally will be having classes in April and that we should probally wait til April to go over instructions closer to that time.

## 2021-09-28 NOTE — Telephone Encounter (Signed)
Available Pump Date Classes:  12/03/21 8:30 am - 11:30 am 12/24/21 8:30 am - 11:30 am  I think it would be a good idea to schedule appt now  If patient would like reminder on what to do closer to appt date please let me know and I will send a MyChart to him two weeks in advance with instructions. Please also let me know what pharmacy he prefers.  Thank you for involving clinical pharmacist/diabetes educator to assist in providing this patient's care.   Drexel Iha, PharmD, BCACP, [place], CPP

## 2021-10-05 NOTE — Telephone Encounter (Signed)
Called and left message for pt to call back or to respond to MyChart message

## 2021-10-07 ENCOUNTER — Telehealth (INDEPENDENT_AMBULATORY_CARE_PROVIDER_SITE_OTHER): Payer: Self-pay | Admitting: Pharmacist

## 2021-10-07 NOTE — Telephone Encounter (Signed)
Appears insurance requires PA for 15 pods/month (does not require PA for 10 pods/month)  Submitted prior authorization on 10/07/21 on covermymeds  Lasalle Abee (Key: [id number]) - [id number] Omnipod 5 G6 Pod (Gen 5) Status: PA Request Created: February 8th, 2023 Sent: February 8th, 2023  Thank you for involving clinical pharmacist/diabetes educator to assist in providing this patient's care.   Drexel Iha, PharmD, BCACP, [place], CPP

## 2021-10-08 ENCOUNTER — Encounter (INDEPENDENT_AMBULATORY_CARE_PROVIDER_SITE_OTHER): Payer: Self-pay | Admitting: Pharmacist

## 2021-10-09 NOTE — Telephone Encounter (Signed)
Checked covermymeds  Luke [name] (Key: [id number]) - [id number] Omnipod 5 G6 Pod (Gen 5) Status: PA Response - Approved - 10/07/21 - 09/24/22 Created: February 8th, 2023 Sent: February 8th, 2023   Luke [name] (Key: [id number]) [phone] Omnipod 5 G6 Intro (Gen 5) kit Status: PA Response - N/A as Omnipod 5 G6 pods PA was approved. Created: February 9th, 2023 Sent: February 9th, 2023  Thank you for involving clinical pharmacist/diabetes educator to assist in providing this patient's care.   Drexel Iha, PharmD, BCACP, [place], CPP

## 2021-11-26 ENCOUNTER — Telehealth (INDEPENDENT_AMBULATORY_CARE_PROVIDER_SITE_OTHER): Payer: Self-pay

## 2021-11-26 ENCOUNTER — Telehealth (INDEPENDENT_AMBULATORY_CARE_PROVIDER_SITE_OTHER): Payer: Self-pay | Admitting: Pediatrics

## 2021-11-26 ENCOUNTER — Encounter (INDEPENDENT_AMBULATORY_CARE_PROVIDER_SITE_OTHER): Payer: Self-pay | Admitting: Pediatrics

## 2021-11-26 DIAGNOSIS — E1065 Type 1 diabetes mellitus with hyperglycemia: Secondary | ICD-10-CM

## 2021-11-26 MED ORDER — DEXCOM G6 TRANSMITTER MISC: 1 refills | Status: DC

## 2021-11-26 NOTE — Telephone Encounter (Signed)
?  Name of who is calling: ?[place] ?Caller's Relationship to Patient: ?Self  ?Best contact number: ?[phone] ?Provider they see: ?Charna Archer ?Reason for call: ? ?Please send in rx for dexcom ? ? ?PRESCRIPTION REFILL ONLY ? ?Name of prescription: ?dexcom ?Pharmacy: ? ?[id number] ?

## 2021-11-26 NOTE — Telephone Encounter (Signed)
Refill sent to [facility] on [address] per pts request ?

## 2021-11-30 ENCOUNTER — Other Ambulatory Visit (INDEPENDENT_AMBULATORY_CARE_PROVIDER_SITE_OTHER): Payer: Self-pay | Admitting: Pharmacist

## 2021-11-30 ENCOUNTER — Telehealth (INDEPENDENT_AMBULATORY_CARE_PROVIDER_SITE_OTHER): Payer: Self-pay | Admitting: Pediatrics

## 2021-11-30 ENCOUNTER — Encounter (INDEPENDENT_AMBULATORY_CARE_PROVIDER_SITE_OTHER): Payer: Self-pay | Admitting: Pediatrics

## 2021-11-30 DIAGNOSIS — E1065 Type 1 diabetes mellitus with hyperglycemia: Secondary | ICD-10-CM

## 2021-11-30 MED ORDER — OMNIPOD 5 DEXG7G6 INTRO GEN 5 KIT: 1.0000 | PACK | 2 refills | Status: DC

## 2021-11-30 NOTE — Telephone Encounter (Signed)
Answered family via mychart and sent in prescription ?

## 2021-11-30 NOTE — Telephone Encounter (Signed)
  Name of who is calling: Dillon Bjork, mother (DPR on file)  Caller's Relationship to Patient:  Best contact number: [phone]  Provider they see: [place]/Dr. Lovena Le  Reason for call: Has rx for Omnipod 5 been sent to pharmacy? If so, which one. If it has not been sent, [facility] [address] in [place].       PRESCRIPTION REFILL ONLY  Name of prescription:   Pharmacy:

## 2021-12-06 ENCOUNTER — Other Ambulatory Visit (INDEPENDENT_AMBULATORY_CARE_PROVIDER_SITE_OTHER): Payer: Self-pay | Admitting: Pediatrics

## 2021-12-06 DIAGNOSIS — E1065 Type 1 diabetes mellitus with hyperglycemia: Secondary | ICD-10-CM

## 2021-12-09 ENCOUNTER — Encounter (INDEPENDENT_AMBULATORY_CARE_PROVIDER_SITE_OTHER): Payer: Self-pay | Admitting: Pharmacist

## 2021-12-09 ENCOUNTER — Telehealth (INDEPENDENT_AMBULATORY_CARE_PROVIDER_SITE_OTHER): Payer: Self-pay | Admitting: Pharmacist

## 2021-12-09 ENCOUNTER — Ambulatory Visit (INDEPENDENT_AMBULATORY_CARE_PROVIDER_SITE_OTHER): Payer: 59 | Admitting: Pharmacist

## 2021-12-09 ENCOUNTER — Other Ambulatory Visit (HOSPITAL_COMMUNITY): Payer: Self-pay

## 2021-12-09 VITALS — Ht 69.8 in | Wt 220.4 lb

## 2021-12-09 DIAGNOSIS — E109 Type 1 diabetes mellitus without complications: Secondary | ICD-10-CM

## 2021-12-09 DIAGNOSIS — E1065 Type 1 diabetes mellitus with hyperglycemia: Secondary | ICD-10-CM

## 2021-12-09 LAB — POCT GLUCOSE (DEVICE FOR HOME USE): POC Glucose: 121 mg/dl — AB (ref 70–99)

## 2021-12-09 MED ORDER — OMNIPOD 5 DEXG7G6 PODS GEN 5 MISC: 1.0000 | 4 refills | Status: DC

## 2021-12-09 MED ORDER — INSULIN LISPRO (1 UNIT DIAL) 100 UNIT/ML (KWIKPEN): PEN_INJECTOR | SUBCUTANEOUS | 3 refills | Status: AC

## 2021-12-09 MED ORDER — BASAGLAR KWIKPEN 100 UNIT/ML ~~LOC~~ SOPN: PEN_INJECTOR | SUBCUTANEOUS | 1 refills | Status: AC

## 2021-12-09 NOTE — Progress Notes (Signed)
? ?Subjective: ? ?Chief Complaint  ?Patient presents with  ? Diabetes  ?  Omnipod 5 Pump Training  ? ? ?Endocrinology provider: Dr. Charna Archer (upcoming appt 12/24/21 10:15 am) ? ?Patient referred to me by Dr. Charna Archer for Omnipod 5 pump training. PMH significant for T1DM. Patient is currently using Dexcom G6 CGM. Patient is currently using the Omnipod Original/Eros insulin pump. ? ?Patient presents today with his mother with the Omnipod 5 Intro Kit and vial of Humalog. Family will need new prescription for test strips and is interested in copay cards for Omnipod 5 and Dexcom G6. ? ?Omnipod Original/Eros insulin pump settings ? ?Insulin regimen: Humalog in omnipod pump ?Basal Rates:  ?12AM 1.55  ?8AM 1.3  ?12PM 1.2  ?2PM 1.15  ?6PM 1.4  ?Total: 33 units daily ?  ?Insulin to carbohydrate ratio ?12AM 7  ?6AM 5  ?10AM 6  ?2PM 7  ?  ?Insulin Sensitivity Factor ?12AM 30  ?     ?     ?     ?     ?  ?Target Blood Glucose ?12AM 140  ?6:30AM 115  ?9AM 140  ?     ?     ?Active insulin time 3 hours  ? ? ?Insurance ?[employer], [place] [employer] ([place]) ?Covered: Retail, Mail OrderNot covered: Unknown: Specialty, Long-Term Care  ?    ?        ?Member ID: [id number] BIN: [id number]  DOB: [birth year]  ?Group ID: [id number] PCN: [id number]  Legal sex: M  ?Group name:   Address: [place]   ?Member name: GREGARY, BLACKARD [place] [address]  ? ? ? ?Pharmacy  ?[id number] [place],  - [address]  ?[place], [place] ?Phone:  [phone] Fax:  [phone] ?DEA #:  [id number] ?DAW Reason: --  ? ?Omnipod 5 Pump Serial Number: [id number]? ?Omnipod Education Training ?Please refer to OHillsboroscanned into media ? ?[place] Account:  ?Username:[email or web address] ?Password: [email or web address] ?[place] Account:  ?Username: [email or web address] ?Password: [email or web address] ?Objective: ? ?Dexcom Clarity Report ? ? ? ?There were no vitals filed for this  visit. ? ?HbA1c ?Lab Results  ?Component Value Date  ? HGBA1C 7.8 09/24/2021  ? HGBA1C 7.4 (A) 11/06/2020  ? HGBA1C 8.6 (A) 08/05/2020  ? ? ?Pancreatic Islet Cell Autoantibodies ?Lab Results  ?Component Value Date  ? ISLETAB <5 06/13/2015  ? ? ?Insulin Autoantibodies ?Lab Results  ?Component Value Date  ? INSULINAB 7.9 (H) 06/13/2015  ? ? ?Glutamic Acid Decarboxylase Autoantibodies ?Lab Results  ?Component Value Date  ? GLUTAMICACAB 22 (H) 06/13/2015  ? ? ?ZnT8 Autoantibodies ?No results found for: ZNT8AB ? ?IA-2 Autoantibodies ?No results found for: LABIA2 ? ?C-Peptide ?Lab Results  ?Component Value Date  ? CPEPTIDE 3.7 09/03/2016  ? ? ?Microalbumin ?Lab Results  ?Component Value Date  ? MICRALBCREAT 7 09/24/2021  ? ? ?Lipids ?   ?Component Value Date/Time  ? CHOL 210 (H) 09/24/2021 1041  ? TRIG 135 (H) 09/24/2021 1041  ? HDL 36 (L) 09/24/2021 1041  ? CHOLHDL 5.8 (H) 09/24/2021 1041  ? LDLCALC 148 (H) 09/24/2021 1041  ? ? ?Assessment: ?Pump Settings - Reviewed Dexcom Clarity report. TIR is not at goal >70%. No hypoglycemia. Omnipod pump was not downloaded, but reviewed pump history; patient typically using 90-120 units per day. Basal was ~25-36% and bolus was 64-75%. Increased basal rates throughout entire day and night. Based  on rule of 450, ideal ICR may be  3.75-5. Based on rule of 1800, ideal ISF may be 15-20. Changed target BG to 110 considering upgrade to hybrid closed loop pump. ? ?Pump Education - Omnipod pump applied successfully to back of right arm (within line of sight with Dexcom on back of right arm). Reviewed failed infusion site and pump back up plan with family. Parents appeared to have sufficient understanding of subjects discussed during Omnipod Training appt. ? ?Copay cards - Will send info to family via [place]. ? ?Plan: ?Pump Settings ? ?Basal Rates (Max: 3.5 units) ?12AM 1.55 --> 1.60  ?8AM 1.3 --> 1.4  ?12PM 1.2 --> 1.35  ?2PM 1.15 --> 1.3  ?6PM 1.4 --> 1.5  ?Total: 33 units daily --> 35.3  units daily  ?  ?Insulin to carbohydrate ratio ?12AM 7  ?6AM 5  ?10AM 6  ?2PM 7  ?Max Bolus: 28 units  ? ?Insulin Sensitivity Factor ?12AM 33  ?8AM 30  ?11PM  33  ?     ?     ?  ?Target Blood Glucose ?12AM 110  ?   ?   ?     ?     ?Active insulin time 3 hours  ? ?Reverse Correction: OFF ? ? ?Omnipod Pump Education:  ?Continue to wear Omnipod and change pod every 2 days (pod filled 200 units) ?Thoroughly discussed how to assess bad infusion site change and appropriate management (notice BG is elevated, attempt to bolus via pump, recheck BG in 30 minutes, if BG has not decreased then disconnect pump and administer bolus via insulin pen, apply new infusion set, and repeat process).  ?Discussed back up plan if pump breaks (how to calculate insulin doses using insulin pens). Provided written copy of patient's current pump settings and handout explaining math on how to calculate settings. Discussed examples with family. Patient was able to use teach back method to demonstrate understanding of calculating dose for basal/bolus insulin pens from insulin pump settings.  ?Patient has Basaglar and Humalog insulin pen refills to use as back up until 11/2022. Reminded family they will need a new prescription annually.  ?Copay Cards: ?Will send info to family via [place]. ?Follow Up:  ?Dr. Charna Archer (upcoming appt 12/24/21 10:15 am) ? ?Emailed [employer] 5 Resource guide to '[email or web address]' ? ?It was a pleasure seeing you today! ? ? ?If your pump breaks, your long acting insulin dose would be Basaglar 35 units daily. You would do the following equation for your Humalog: ? ?Humalog total dose = food dose + correction dose ?Food dose: total carbohydrates divided by insulin carbohydrate ratio (ICR) ?Your ICR is 5 for breakfast, 6 for lunch, and 7 for dinner ?Correction dose: (current blood sugar - target blood sugar) divided by insulin sensitivity factor (ISF) ?Your ISF is 30 during the day and 33 during the night. ?Your target blood sugar  is 120 during the day and 180 at night. ? ?You can use the app boluscalc to calculate Humalog doses in case of pump failure. Make sure to go into settings (wheel) to change from [place] ? [place] ([place] uses different dosing increments). Then you will go into simple insulin bolus to type in different carb ratio, sensitivity factor, and target blood sugar. You will then type in current BG (blood glucose (or sugar)) before you eat and the carbs for that meal then press calculate. ? ?PLEASE REMEMBER TO CONTACT OFFICE IF YOU ARE AT RISK OF RUNNING OUT OF PUMP SUPPLIES, INSULIN PEN  SUPPLIES, OR IF YOU WANT TO KNOW WHAT YOUR BACK UP INSULIN PEN DOSES ARE.  ? ?To summarize our visit, these are the major updates with Omnipod 5: ? ?Automated vs limited vs manual mode ?Automated mode: this is when the ?smart? pump is turned on and pump will adjust insulin based on Dexcom readings predicted 60 minutes into the future ?Limited mode: when pump is trying to connect to automated mode, however, there may be issues. For example, when new Dexcom sensor is applied there is a 2 hour warm up period (no CGM readings). ?Manual mode: this is when the ?smart? pump is NOT turned on and pump goes back to settings put in by provider (kind of like going back to [employer]) ?You can switch modes by going to settings --> mode --> switch from automated to manual mode or vice versa ?Why would I switch from automated mode to manual mode? ?1. To put in new Dexcom transmitter code (reminder you must do this every 90 days AFTER you update it in Dexcom app) ?To do this you will change to manual mode --> settings --> CGM transmitter --> enter new code ?2. If you get put on steroid medications (e.g., prednisone, methylprednisolone) ?3. If you try activity mode and still experience low blood sugars then you can go to manual mode to turn on a temporary basal rate (decrease 100% in 30 min incrememnts) ?KEEP IN MIND LINE OF SIGHT WITH DEXCOM! Dexcom and pod must be  on the same side of the body. They can be across from each other on the abdomen or lower back/upper buttocks (refer to pages 20 and 21 in resource guide) ?Make sure to press use CGM rather than type in blood s

## 2021-12-09 NOTE — Patient Instructions (Addendum)
It was a pleasure seeing you today! ? ? ?If your pump breaks, your long acting insulin dose would be Basaglar 35 units daily. You would do the following equation for your Humalog: ? ?Humalog total dose = food dose + correction dose ?Food dose: total carbohydrates divided by insulin carbohydrate ratio (ICR) ?Your ICR is 5 for breakfast, 6 for lunch, and 7 for dinner ?Correction dose: (current blood sugar - target blood sugar) divided by insulin sensitivity factor (ISF) ?Your ISF is 30 during the day and 33 during the night. ?Your target blood sugar is 120 during the day and 180 at night. ? ?PLEASE REMEMBER TO CONTACT OFFICE IF YOU ARE AT RISK OF RUNNING OUT OF PUMP SUPPLIES, INSULIN PEN SUPPLIES, OR IF YOU WANT TO KNOW WHAT YOUR BACK UP INSULIN PEN DOSES ARE.  ? ?To summarize our visit, these are the major updates with Omnipod 5: ? ?Automated vs limited vs manual mode ?Automated mode: this is when the ?smart? pump is turned on and pump will adjust insulin based on Dexcom readings predicted 60 minutes into the future ?Limited mode: when pump is trying to connect to automated mode, however, there may be issues. For example, when new Dexcom sensor is applied there is a 2 hour warm up period (no CGM readings). ?Manual mode: this is when the ?smart? pump is NOT turned on and pump goes back to settings put in by provider (kind of like going back to [employer]) ?You can switch modes by going to settings --> mode --> switch from automated to manual mode or vice versa ?Why would I switch from automated mode to manual mode? ?1. To put in new Dexcom transmitter code (reminder you must do this every 90 days AFTER you update it in Dexcom app) ?To do this you will change to manual mode --> settings --> CGM transmitter --> enter new code ?2. If you get put on steroid medications (e.g., prednisone, methylprednisolone) ?3. If you try activity mode and still experience low blood sugars then you can go to manual mode to turn on a  temporary basal rate (decrease 100% in 30 min incrememnts) ?KEEP IN MIND LINE OF SIGHT WITH DEXCOM! Dexcom and pod must be on the same side of the body. They can be across from each other on the abdomen or lower back/upper buttocks (refer to pages 20 and 21 in resource guide) ?Make sure to press use CGM rather than type in blood sugar when blousing. When you press use CGM it takes in consideration the Dexcom reading AND arrow.  ?Omnipod 5 pods will have a clear tab and have Omnipod 5 written on pod compared to Dash pods (blue tab). Omnipod Dash and Omnipod 5 pods cannot be interchangeable. You must solely use Omnipod 5 pods when using Omnipod 5 PDM/app.  ?If your Omnipod is having issues with receiving Dexcom readings make sure to move the PDM/cellphone closer to the POD (NOT the Dexcom) (refer to page 9 of resource guide to review system communication) ? ?Please contact me (Dr. Lovena Le) at [phone] or via [place] with any questions/concerns   ? ?

## 2021-12-09 NOTE — Telephone Encounter (Signed)
Please run benefits investigation for preferred test strips ?  ?Accu Chek guide test strips (quantity 200, 30 day supply) ? ?Accu Chek advantage test strips (quantity 200, 30 day supply) ? ?Onetouch ultra test strips (quantity 200, 30 day supply) ? ?Onetouch verio test strips (quantity 200, 30 day supply) ?  ?Thank you for involving clinical pharmacist/diabetes educator to assist in providing this patient's care.  ?  ?Drexel Iha, PharmD, BCACP, [place], CPP  ?

## 2021-12-10 ENCOUNTER — Other Ambulatory Visit (INDEPENDENT_AMBULATORY_CARE_PROVIDER_SITE_OTHER): Payer: Self-pay | Admitting: Pharmacist

## 2021-12-10 DIAGNOSIS — E1065 Type 1 diabetes mellitus with hyperglycemia: Secondary | ICD-10-CM

## 2021-12-10 MED ORDER — ONETOUCH DELICA LANCETS 33G MISC: 11 refills | Status: AC

## 2021-12-10 MED ORDER — ONETOUCH VERIO W/DEVICE KIT: PACK | 1 refills | Status: AC

## 2021-12-10 MED ORDER — GLUCOSE BLOOD VI STRP: ORAL_STRIP | 11 refills | Status: AC

## 2021-12-14 ENCOUNTER — Encounter (INDEPENDENT_AMBULATORY_CARE_PROVIDER_SITE_OTHER): Payer: Self-pay | Admitting: Pediatrics

## 2021-12-15 ENCOUNTER — Other Ambulatory Visit (INDEPENDENT_AMBULATORY_CARE_PROVIDER_SITE_OTHER): Payer: Self-pay

## 2021-12-15 DIAGNOSIS — E1065 Type 1 diabetes mellitus with hyperglycemia: Secondary | ICD-10-CM

## 2021-12-15 MED ORDER — DEXCOM G6 SENSOR MISC: 5 refills | Status: DC

## 2021-12-15 MED ORDER — DEXCOM G6 TRANSMITTER MISC: 1 refills | Status: DC

## 2021-12-15 NOTE — Progress Notes (Signed)
I have reviewed the following documentation and am in agreeance with the plan. I was immediately available to the clinical pharmacist for questions and collaboration.  ? ?Keyaan Lederman Bashioum Christapher Gillian, MD  ?

## 2021-12-24 ENCOUNTER — Ambulatory Visit (INDEPENDENT_AMBULATORY_CARE_PROVIDER_SITE_OTHER): Payer: 59 | Admitting: Pediatrics

## 2022-02-15 ENCOUNTER — Telehealth (INDEPENDENT_AMBULATORY_CARE_PROVIDER_SITE_OTHER): Payer: Self-pay | Admitting: Pediatrics

## 2022-02-15 NOTE — Telephone Encounter (Signed)
Team health ID: [id number]  I spoke with his mother regarding concerned that he is almost in [place], but left all of his insulin and diabetes supplies at home.  We discussed that he would be able to call and transfer his prescriptions to a local pharmacy to obtain insulin.  His mother disclosed that his next refill is due on the second.  We discussed that he would have to pay out-of-pocket if the insurance did not have an exception.  She decided to send his diabetes medications and supplies in an insulated cooler with a cold pack via [employer].  Al Corpus, MD 02/15/2022

## 2022-02-27 ENCOUNTER — Other Ambulatory Visit (INDEPENDENT_AMBULATORY_CARE_PROVIDER_SITE_OTHER): Payer: Self-pay | Admitting: Pediatrics

## 2022-02-27 DIAGNOSIS — E1065 Type 1 diabetes mellitus with hyperglycemia: Secondary | ICD-10-CM

## 2022-03-16 ENCOUNTER — Ambulatory Visit (INDEPENDENT_AMBULATORY_CARE_PROVIDER_SITE_OTHER): Payer: 59 | Admitting: Pediatrics

## 2022-03-31 ENCOUNTER — Encounter (INDEPENDENT_AMBULATORY_CARE_PROVIDER_SITE_OTHER): Payer: Self-pay

## 2022-04-01 ENCOUNTER — Telehealth (INDEPENDENT_AMBULATORY_CARE_PROVIDER_SITE_OTHER): Payer: Self-pay | Admitting: Pediatrics

## 2022-04-01 NOTE — Telephone Encounter (Signed)
Received DMV forms from pt will place on Dr Grover Canavan desk for her to address when she returns to office on Tuesday Aug 8th.

## 2022-04-01 NOTE — Telephone Encounter (Signed)
Who's calling (name and relationship to patient) : Jonah Carfagno dad  Best contact number: [phone]  Provider they see: Dr. Charna Archer   Reason for call: Dad dropped off DMV paperwork. He filled out his part. The rest needs to be completed by provider. Dad did get something in mail stating if the paperwork wasn't done by the 13th of August patients liscense would be revoked. Dad stated he understood it was short notice.   Dad states he would like a call when papers are faxed to [facility] placed in providers box Call ID:      PRESCRIPTION REFILL ONLY  Name of prescription:  Pharmacy:

## 2022-04-05 NOTE — Telephone Encounter (Signed)
Faxed forms to [facility],  Called Dad to update. Will put originals up front for patient to pick up.

## 2022-04-07 ENCOUNTER — Telehealth (INDEPENDENT_AMBULATORY_CARE_PROVIDER_SITE_OTHER): Payer: Self-pay | Admitting: Pediatrics

## 2022-04-07 ENCOUNTER — Ambulatory Visit (INDEPENDENT_AMBULATORY_CARE_PROVIDER_SITE_OTHER): Payer: 59 | Admitting: Pediatrics

## 2022-04-07 ENCOUNTER — Encounter (INDEPENDENT_AMBULATORY_CARE_PROVIDER_SITE_OTHER): Payer: Self-pay | Admitting: Pediatrics

## 2022-04-07 VITALS — BP 120/80 | HR 86 | Ht 70.04 in | Wt 224.4 lb

## 2022-04-07 DIAGNOSIS — Z4681 Encounter for fitting and adjustment of insulin pump: Secondary | ICD-10-CM

## 2022-04-07 DIAGNOSIS — E1065 Type 1 diabetes mellitus with hyperglycemia: Secondary | ICD-10-CM

## 2022-04-07 LAB — POCT GLYCOSYLATED HEMOGLOBIN (HGB A1C): Hemoglobin A1C: 7 % — AB (ref 4.0–5.6)

## 2022-04-07 LAB — POCT GLUCOSE (DEVICE FOR HOME USE): Glucose Fasting, POC: 115 mg/dL — AB (ref 70–99)

## 2022-04-07 MED ORDER — OMNIPOD 5 DEXG7G6 PODS GEN 5 MISC: 1.0000 | 11 refills | Status: DC

## 2022-04-07 MED ORDER — HUMALOG KWIKPEN 200 UNIT/ML ~~LOC~~ SOPN: PEN_INJECTOR | SUBCUTANEOUS | 6 refills | Status: DC

## 2022-04-07 MED ORDER — DEXCOM G6 SENSOR MISC: 11 refills | Status: DC

## 2022-04-07 NOTE — Telephone Encounter (Signed)
Called Dad to update that the fax was transmitted successfully this am after multiple fax attempts. Dad verbalized understanding and will call DMV to follow up.  Also reminded dad to have Chadric pick up forms during his appt. Today.

## 2022-04-07 NOTE — Progress Notes (Addendum)
Pediatric Endocrinology Consultation Follow-up Visit  Chief Complaint: Type 1 diabetes  HPI: Luke [name] is a 21 y.o. male presenting for follow-up of the above concerns.  he is accompanied to this visit by his mother.      1. Jabree was initially referred to [facility] in 05/2015 for concerns of new onset diabetes.  He was seen by his PCP on 06/10/2015 for a well child check.  At that visit, blood work was obtained due to him being overweight including a random blood sugar that was elevated to 297 (this was obtained 3-4 hours after eating a cheeseburger, potato wedges, apple slices, chocolate milk, a can of pineapple soda, and a cupcake with frosting).  A lipid panel was also obtained 06/10/15 showing total cholesterol 199, HDL 31, LDL 109.  He was called back to PCP's office on 06/11/2015 for fasting blood work (fingerstick glucose 155, 146 on CMP), UA (negative for ketones and glucose), A1c 7.7%.  CMP normal except elevated glucose (including normal BUN /Cr and AST/ALT).  TSH slightly elevated at 5.349 (0.4-5) with normal FT4 of 0.93.  He was initially seen at [facility] on 06/13/15 where A1c was 7.4%, GAD Ab and insulin Ab were positive and C-peptide was normal and he was diagnosed with evolving T1DM. He was started on metformin at that visit prior to Ab results.  Metformin was discontinued in 07/2015 after several blood sugars were in the 60s.  He also had normal TFTs and celiac screen in 07/2015.  A1c gradually increased and blood sugars started becoming elevated so he was started on rapid-acting insulin in 10/2016 and started on long-acting insulin within the next week.  He started on an omnipod pump in 02/2017.  He started on a dexcom CGM in 07/2018.   He transitioned to omnipod 5 in 11/2021.  2. Since last visit on 09/24/21, he has been well.   ED visits/Hospitalizations for diabetes: ED visit in 12/2021 for hyperglycemia; needed syringes so he could give novolog as pharmacy had given him the wrong  pods  Concerns:  Has been using activity setting more frequently.  Having less lows.  Likes the omnipod 5. Having to change pods every 2 days  Insulin regimen: Humalog in omnipod pump Basal Rates:  12AM 1.6  8AM 1.4  12PM 1.35  2PM 1.3  6PM 1.5  Total: 35.3 units daily   Insulin to carbohydrate ratio 12AM 7  6AM 5  10AM 6  2PM 7    Insulin Sensitivity Factor 12AM 33   8AM 30  11PM 33              Target Blood Glucose 12AM 110                  Active insulin time 3 hours   BG/Pump download:     CGM download:      Interpretation: higher in the evening  Hypoglycemia:  Able to feel lows.  No glucagon needed recently.  Wearing Med-alert ID currently: Not currently. Reminded to wear one Injection sites: abdominal wall and arm(s) Annual labs due: 04/2021.  Lipids elevated at last visit; will repeat today. Ophthalmology due: last eye exam 12/2020; no concerns per mom. Needs to schedule with an adult provider.   ROS: All systems reviewed with pertinent positives listed below; otherwise negative. Constitutional: Weight has increased 6lb since last visit.   Eating well.   Past Medical History:  Past Medical History:  Diagnosis Date   Diabetes mellitus without complication ([place])  Elevated hemoglobin A1c    05/2015 A1c 7.7% with fasting hyperglycemia.  + GAD ab and + insulin Ab    Meds: Current Outpatient Medications on File Prior to Visit  Medication Sig Dispense Refill   Blood Glucose Monitoring Suppl (ONETOUCH VERIO) w/Device KIT Use 1 kit as directed to monitor blood glucose up to 6x daily 1 kit 1   Continuous Blood Gluc Transmit (DEXCOM G6 TRANSMITTER) MISC Use with Dexcom sensor, reuse for 3 months 1 each 1   glucose blood (FREESTYLE LITE) test strip Use to check blood sugar 6x per day 600 strip 1   glucose blood test strip Use as directed to monitor blood glucose up to 6x daily 200 each 11   Insulin Disposable Pump (OMNIPOD 5 G6 INTRO, GEN 5,) KIT  Inject 1 kit into the skin as directed. . Change pod every 2 days. Intro kit comes with 2 boxes of pods, PDM device, pod pals, and user manual. Please fill for Omnipod 5 Into kit [facility] [id number] 1 kit 2   Insulin Glargine (BASAGLAR KWIKPEN) 100 UNIT/ML Inject up to 50 units per day 45 mL 1   insulin lispro (HUMALOG KWIKPEN) 100 UNIT/ML KwikPen Inject up to 50 units per day 45 mL 3   insulin lispro (HUMALOG) 100 UNIT/ML injection 200 UNITS OF INSULIN IN PUMP EVERY 48 HOURS 40 mL 5   Insulin Pen Needle (INSUPEN PEN NEEDLES) 32G X 4 MM MISC Inject insulin via insulin pen 6 x daily 200 each 3   OneTouch Delica Lancets 91T MISC Use as directed to monitor blood glucose up to 6x daily 200 each 11   Glucagon (BAQSIMI TWO PACK) 3 MG/DOSE POWD Place 1 application into the nose as needed. Use as directed if unconscious, unable to take food po, or having a seizure due to hypoglycemia (Patient not taking: Reported on 03/04/2021) 2 each 1   glucagon 1 MG injection Use for Severe Hypoglycemia . Inject 62m intramuscularly if unresponsive, unable to swallow, unconscious and/or has seizure (Patient not taking: Reported on 11/06/2020) 1 kit 2   No current facility-administered medications on file prior to visit.   Allergies: No Known Allergies  Surgical History: Past Surgical History:  Procedure Laterality Date   APPENDECTOMY     LAPAROSCOPIC APPENDECTOMY N/A 12/16/2013   Procedure: APPENDECTOMY LAPAROSCOPIC;  Surgeon: MJerilynn Mages SGerald Stabs MD;  [facility] [facility]  Service: Pediatrics;  Laterality: N/A;   Family History:  Family History  Problem Relation Age of Onset   Kidney disease Paternal Grandmother    Kidney disease Paternal Grandfather    Hypertension Maternal Grandmother    Thyroid disease Maternal Grandfather    Healthy Mother    No strong family history of T2DM; only known family member with T2DM is paternal great grandmother.  No family history of T1DM.  MGF has hyperthyroidism, treated with oral  medication (has never had surgery).  No other autoimmune diseases in the family.  Social History: Lives with: parents.  He is an only child Attending [employer]  Physical Exam:  Vitals:   04/07/22 1043  BP: 120/80  Pulse: 86  Weight: 224 lb 6.4 oz (101.8 kg)  Height: 5' 10.04" (1.779 m)   Body mass index: body mass index is 32.16 kg/m. Blood pressure %iles are not available for patients who are 18 years or older.   Wt Readings from Last 3 Encounters:  04/07/22 224 lb 6.4 oz (101.8 kg) (97 %, Z= 1.95)*  12/09/21 220 lb 6.4 oz (  100 kg) (97 %, Z= 1.89)*  09/24/21 218 lb 6.4 oz (99.1 kg) (97 %, Z= 1.86)*   * Growth percentiles are based on CDC (Boys, 2-20 Years) data.   Ht Readings from Last 3 Encounters:  04/07/22 5' 10.04" (1.779 m) (56 %, Z= 0.15)*  12/09/21 5' 9.8" (1.773 m) (53 %, Z= 0.08)*  08/05/20 5' 10.47" (1.79 m) (65 %, Z= 0.39)*   * Growth percentiles are based on CDC (Boys, 2-20 Years) data.   Body mass index is 32.16 kg/m.  97 %ile (Z= 1.95) based on CDC (Boys, 2-20 Years) weight-for-age data using vitals from 04/07/2022. 56 %ile (Z= 0.15) based on CDC (Boys, 2-20 Years) Stature-for-age data based on Stature recorded on 04/07/2022.   General: Well developed, well nourished male in no acute distress.  Appears stated age Head: Normocephalic, atraumatic.   Eyes:  Pupils equal and round. EOMI.   Sclera white.  No eye drainage.   Ears/Nose/Mouth/Throat: Nares patent, no nasal drainage.  Moist mucous membranes, normal dentition Neck: supple, no cervical lymphadenopathy, no thyromegaly Cardiovascular: regular rate, normal S1/S2, no murmurs Respiratory: No increased work of breathing.  Lungs clear to auscultation bilaterally.  No wheezes. Abdomen: soft, nontender, nondistended.  Extremities: warm, well perfused, cap refill < 2 sec.   Musculoskeletal: Normal muscle mass.  Normal strength Skin: warm, dry.  No rash or lesions. Neurologic: alert and oriented,  normal speech, no tremor   Laboratory Evaluation: Results for orders placed or performed in visit on 04/07/22  POCT glycosylated hemoglobin (Hb A1C)  Result Value Ref Range   Hemoglobin A1C 7.0 (A) 4.0 - 5.6 %   HbA1c POC (<> result, manual entry)     HbA1c, POC (prediabetic range)     HbA1c, POC (controlled diabetic range)    POCT Glucose (Device for Home Use)  Result Value Ref Range   Glucose Fasting, POC 115 (A) 70 - 99 mg/dL   POC Glucose      Last A1c 5.1% in 10/2015--> 5.4% in 12/2015-->5.7%-->6.0%-->6.8% 10/28/16-->8.2% 01/2017-->7.8% in 03/2017-->7.6% 08/2017-->8.8% in 11/2017-->8.6% 02/2018-->9.2% 06/2018--> 8.4% 09/2018--> 7.8% 03/2019--> 8.4% 07/2019--> 8.6% 10/2019--> 8% 03/2020--> 8.6% 07/2020--> 7.4% 10/2020--> 7.8% 09/24/21--> 7% 03/2022  Assessment/Plan: Luke [name] is a 21 y.o. male with T1DM on a pump (omnipod 5) and CGM regimen.   A1c is lower than last visit and is at the ADA goal of <7.0%.  Dexcom tracing shows he is not meeting goal of TIR >70%.   When a patient is on insulin, intensive monitoring of blood glucose levels and continuous insulin titration is vital to avoid insulin toxicity leading to severe hypoglycemia. Severe hypoglycemia can lead to seizure or death. Hyperglycemia can also result from inadequate insulin dosing and can lead to ketosis requiring ICU admission and intravenous insulin.   1. Type 1 diabetes with hyperglycemia - POCT Glucose and POCT HgB A1C as above -Will draw lipid panel today. Discussed that we may need to start a statin if LDL remains elevated. -Encouraged to wear med alert ID every day -Discussed changing to Humalog U-200 to make pods last longer.  He is interested. Provided with humalog U-200 sample pen.  Printed copy of current pump settings and new pump settings if using U-200.  Showed him how to clear pump memory when changing to U-200. -Provided with my contact information and advised to email/send mychart with questions/need for BG  review -CGM download reviewed extensively (see interpretation above) -Rx sent to pharmacy include: omnipod 5 pods and dexcom G6 sensors to  [facility] [place].  Will send rx for humalog u-[facility] to [facility] in May  2. Insulin pump titration -Made the following pump changes: Pump Settings: Basal Rates: Change to U-200 12AM 1.6-->0.8  8AM 1.4-->0.7  12PM 1.35-->0.7  2PM 1.3-->0.65  6PM 1.5-->0.75  Total: 35.3 units daily   Insulin to carbohydrate ratio 12AM 7-->14  6AM 5-->10  10AM 6-->12  2PM 7-->14    Insulin Sensitivity Factor 12AM 33-->60   8AM 30-->60  11PM 33-->60              Target Blood Glucose 12AM 110                  Active insulin time 3 hours   If your pump breaks: Back-up lantus dose 35 units every 24 hours Humalog U-200 1 unit for every 12 carbs (carb ratio)  Humalog U-200 correction 1 unit for every 60 above 16m/dl (2036mdl at bedtime)   Please call [phone] questions     Follow-up:   Return in about 4 months (around 08/07/2022).   >40 minutes spent today reviewing the medical chart, counseling the patient/family, and documenting today's encounter.   AsLevon HedgerMD  -------------------------------- 04/13/22 9:48 AM ADDENDUM: Results for orders placed or performed in visit on 04/07/22  Lipid panel  Result Value Ref Range   Cholesterol 188 (H) <170 mg/dL   HDL 35 (L) >45 mg/dL   Triglycerides 209 (H) <90 mg/dL   LDL Cholesterol (Calc) 120 (H) <110 mg/dL (calc)   Total CHOL/HDL Ratio 5.4 (H) <5.0 (calc)   Non-HDL Cholesterol (Calc) 153 (H) <120 mg/dL (calc)  POCT glycosylated hemoglobin (Hb A1C)  Result Value Ref Range   Hemoglobin A1C 7.0 (A) 4.0 - 5.6 %   HbA1c POC (<> result, manual entry)     HbA1c, POC (prediabetic range)     HbA1c, POC (controlled diabetic range)    POCT Glucose (Device for Home Use)  Result Value Ref Range   Glucose Fasting, POC 115 (A) 70 - 99 mg/dL   POC Glucose    Sent the following  mychart message: Hi, Your lipid panel has improved and we do not need to start cholesterol medicine at this point.   Please let me know if you have questions! Dr. JeCharna Archer

## 2022-04-07 NOTE — Patient Instructions (Addendum)
It was a pleasure to see you in clinic today.   Feel free to contact our office during normal business hours at [phone] with questions or concerns. If you have an emergency after normal business hours, please call the above number to reach our answering service who will contact the on-call pediatric endocrinologist.  If you choose to communicate with [place] via [place], please do not send urgent messages as this inbox is NOT monitored on nights or weekends.  Urgent concerns should be discussed with the on-call pediatric endocrinologist.  -Always have fast sugar with you in case of low blood sugar (glucose tabs, regular juice or soda, candy) -Always wear your ID that states you have diabetes -Always bring your meter/continuous glucose monitor to your visit -Call/Email if you want to review blood sugars  Please go to the following address to have labs drawn after today's visit: [address] [place] [phone] [place], [place] [address]   Pump Settings: Basal Rates: Change to U-200 12AM 1.6-->0.8  8AM 1.4-->0.7  12PM 1.35-->0.7  2PM 1.3-->0.65  6PM 1.5-->0.75  Total: 35.3 units daily   Insulin to carbohydrate ratio 12AM 7-->14  6AM 5-->10  10AM 6-->12  2PM 7-->14    Insulin Sensitivity Factor 12AM 33-->60   8AM 30-->60  11PM 33-->60              Target Blood Glucose 12AM 110                  Active insulin time 3 hours   If your pump breaks: Back-up lantus dose 35 units every 24 hours Humalog U-200 1 unit for every 12 carbs (carb ratio)  Humalog U-200 correction 1 unit for every 60 above '150mg'$ /dl ('200mg'$ /dl at bedtime)   Please call [phone] with questions

## 2022-04-07 NOTE — Telephone Encounter (Signed)
  Name of who is calling: Jonah  Caller's Relationship to Patient: Dad  Best contact number: [phone]  Provider they see: Dr. Charna Archer  Reason for call: Dad was calling to see if provider received fax from [facility] so that Odysseus will be able to drive. Dad is requesting a call back.      PRESCRIPTION REFILL ONLY  Name of prescription:  Pharmacy:

## 2022-04-09 LAB — LIPID PANEL
Cholesterol: 188 mg/dL — ABNORMAL HIGH (ref <170)
HDL: 35 mg/dL — ABNORMAL LOW (ref >45)
LDL Cholesterol (Calc): 120 mg/dL (calc) — ABNORMAL HIGH (ref <110)
Non-HDL Cholesterol (Calc): 153 mg/dL (calc) — ABNORMAL HIGH (ref <120)
Total CHOL/HDL Ratio: 5.4 (calc) — ABNORMAL HIGH (ref <5.0)
Triglycerides: 209 mg/dL — ABNORMAL HIGH (ref <90)

## 2022-05-27 ENCOUNTER — Encounter (INDEPENDENT_AMBULATORY_CARE_PROVIDER_SITE_OTHER): Payer: Self-pay | Admitting: Pediatrics

## 2022-05-27 DIAGNOSIS — E1065 Type 1 diabetes mellitus with hyperglycemia: Secondary | ICD-10-CM

## 2022-05-31 MED ORDER — HUMALOG KWIKPEN 200 UNIT/ML ~~LOC~~ SOPN: PEN_INJECTOR | SUBCUTANEOUS | 6 refills | Status: DC

## 2022-08-17 ENCOUNTER — Encounter (INDEPENDENT_AMBULATORY_CARE_PROVIDER_SITE_OTHER): Payer: Self-pay

## 2022-08-18 ENCOUNTER — Ambulatory Visit (INDEPENDENT_AMBULATORY_CARE_PROVIDER_SITE_OTHER): Payer: 59 | Admitting: Pediatrics

## 2022-08-18 ENCOUNTER — Telehealth (INDEPENDENT_AMBULATORY_CARE_PROVIDER_SITE_OTHER): Payer: Self-pay | Admitting: Pediatrics

## 2022-08-18 NOTE — Telephone Encounter (Signed)
Prevnar is a vaccination for pneumonia and I recommend them discussing this with their primary care doctor. It is recommended not to receive this vaccination while ill with covid. If they mean the medication Paxlovid, there is no contraindication from a diabetes standpoint in taking this medication. However, they need to talk to their primary care doctor regarding Paxlovid and what to expect.

## 2022-08-18 NOTE — Telephone Encounter (Signed)
Returned call, patient answered, I relayed Dr. Rockwell Alexandria message.  He did mean to say Paxlovid.  He then asked who does he get the script from his PCP or [place], I told him he would need to discuss with his PCP and they would be the ones to decide about ordering it. He verbalized understanding.

## 2022-08-18 NOTE — Telephone Encounter (Signed)
  Name of who is calling:mom   Caller's Relationship to Patient:self / mother   Best contact [phone]   Provider they see:Dr. Charna Archer   Reason for call:mom called because Furious is positive for covid and wanted to see if it was safe for him to take certain medication prevnar. Please advise.      PRESCRIPTION REFILL ONLY  Name of prescription:  Pharmacy:

## 2022-08-26 ENCOUNTER — Ambulatory Visit (INDEPENDENT_AMBULATORY_CARE_PROVIDER_SITE_OTHER): Payer: 59 | Admitting: Pediatrics

## 2022-08-26 ENCOUNTER — Encounter (INDEPENDENT_AMBULATORY_CARE_PROVIDER_SITE_OTHER): Payer: Self-pay | Admitting: Pediatrics

## 2022-08-26 VITALS — BP 120/70 | HR 87 | Ht 70.2 in | Wt 212.2 lb

## 2022-08-26 DIAGNOSIS — Z4681 Encounter for fitting and adjustment of insulin pump: Secondary | ICD-10-CM | POA: Diagnosis not present

## 2022-08-26 DIAGNOSIS — E1065 Type 1 diabetes mellitus with hyperglycemia: Secondary | ICD-10-CM

## 2022-08-26 LAB — POCT GLUCOSE (DEVICE FOR HOME USE): Glucose Fasting, POC: 111 mg/dL — AB (ref 70–99)

## 2022-08-26 LAB — POCT GLYCOSYLATED HEMOGLOBIN (HGB A1C): HbA1c POC (<> result, manual entry): 8.6 % (ref 4.0–5.6)

## 2022-08-26 MED ORDER — HUMALOG KWIKPEN 200 UNIT/ML ~~LOC~~ SOPN: PEN_INJECTOR | SUBCUTANEOUS | 6 refills | Status: DC

## 2022-08-26 MED ORDER — DEXCOM G6 TRANSMITTER MISC: 1 refills | Status: DC

## 2022-08-26 MED ORDER — BAQSIMI TWO PACK 3 MG/DOSE NA POWD: 1.0000 "application " | NASAL | 1 refills | Status: AC | PRN

## 2022-08-26 MED ORDER — DEXCOM G6 SENSOR MISC: 11 refills | Status: DC

## 2022-08-26 NOTE — Progress Notes (Signed)
Pediatric Endocrinology Consultation Follow-up Visit  Chief Complaint: Type 1 diabetes  HPI: Luke [name] is a 21 y.o. male presenting for follow-up of the above concerns.  he is accompanied to this visit by his mother.     1. Luke [name] was initially referred to [facility] in 05/2015 for concerns of new onset diabetes.  He was seen by his PCP on 06/10/2015 for a well child check.  At that visit, blood work was obtained due to him being overweight including a random blood sugar that was elevated to 297 (this was obtained 3-4 hours after eating a cheeseburger, potato wedges, apple slices, chocolate milk, a can of pineapple soda, and a cupcake with frosting).  A lipid panel was also obtained 06/10/15 showing total cholesterol 199, HDL 31, LDL 109.  He was called back to PCP's office on 06/11/2015 for fasting blood work (fingerstick glucose 155, 146 on CMP), UA (negative for ketones and glucose), A1c 7.7%.  CMP normal except elevated glucose (including normal BUN /Cr and AST/ALT).  TSH slightly elevated at 5.349 (0.4-5) with normal FT4 of 0.93.  He was initially seen at [facility] on 06/13/15 where A1c was 7.4%, GAD Ab and insulin Ab were positive and C-peptide was normal and he was diagnosed with evolving T1DM. He was started on metformin at that visit prior to Ab results.  Metformin was discontinued in 07/2015 after several blood sugars were in the 60s.  He also had normal TFTs and celiac screen in 07/2015.  A1c gradually increased and blood sugars started becoming elevated so he was started on rapid-acting insulin in 10/2016 and started on long-acting insulin within the next week.  He started on an omnipod pump in 02/2017.  He started on a dexcom CGM in 07/2018.   He transitioned to omnipod 5 in 11/2021.  2. Since last visit on 04/07/22, he has been well.   ED visits/Hospitalizations for diabetes: None  Concerns:  -Has been higher recently since having COVID last week.    Insulin regimen: Humalog U-200 in omnipod  pump  Basal Rates: Change to U-200 12AM 0.8  8AM 0.7  12PM 0.7  2PM 0.65  6PM 0.75  Total: 17.7 units daily   Insulin to carbohydrate ratio 12AM 14  6AM 10  10AM 12  2PM 14    Insulin Sensitivity Factor 12AM 60   8AM 60  11PM 60              Target Blood Glucose 12AM 110                  Active insulin time 3 hours   BG/Pump download:      CGM download:      Interpretation: Having higher blood sugars recently due to COVID; also having pump switch to manual mode  Hypoglycemia:  Able to feel lows.  No glucagon needed recently. Will need new baqsimi rx soon.  Wearing Med-alert ID currently: Not currently. Reminded to wear one. Injection sites: abdominal wall and arm(s) Annual labs due: 08/2022 Ophthalmology due: last eye exam 12/2020; no concerns per mom. Reminded to schedule a dilated eye exam  ROS: All systems reviewed with pertinent positives listed below; otherwise negative. Constitutional: Weight has decreased 12lb since last visit.   No increase in activity.  No bloody stools.  No diarrhea.  Eating well.    Past Medical History:  Past Medical History:  Diagnosis Date   Diabetes mellitus without complication (HCC)    Elevated hemoglobin A1c  05/2015 A1c 7.7% with fasting hyperglycemia.  + GAD ab and + insulin Ab    Meds: Current Outpatient Medications on File Prior to Visit  Medication Sig Dispense Refill   Blood Glucose Monitoring Suppl (ONETOUCH VERIO) w/Device KIT Use 1 kit as directed to monitor blood glucose up to 6x daily 1 kit 1   Continuous Blood Gluc Sensor (DEXCOM G6 SENSOR) MISC CHANGE SENSOR EVERY 10 DAYS 3 each 11   Continuous Blood Gluc Transmit (DEXCOM G6 TRANSMITTER) MISC Use with Dexcom sensor, reuse for 3 months 1 each 1   Glucagon (BAQSIMI TWO PACK) 3 MG/DOSE POWD Place 1 application into the nose as needed. Use as directed if unconscious, unable to take food po, or having a seizure due to hypoglycemia 2 each 1   glucose blood  test strip Use as directed to monitor blood glucose up to 6x daily 200 each 11   Insulin Disposable Pump (OMNIPOD 5 G6 POD, GEN 5,) MISC Inject 1 Device into the skin as directed. Change pod every 2 days. Patient will need 3 boxes (each contain 5 pods) for a 30 day supply. Please fill for [facility] [id number]. 15 each 11   insulin lispro (HUMALOG KWIKPEN) 200 UNIT/ML KwikPen Inject 200 units of U-200 into insulin pump every 2-3 days (total daily dose 120 units daily of U-100 or 60 units daily U-200) 18 mL 6   insulin lispro (HUMALOG) 100 UNIT/ML injection 200 UNITS OF INSULIN IN PUMP EVERY 48 HOURS 40 mL 5   Insulin Pen Needle (INSUPEN PEN NEEDLES) 32G X 4 MM MISC Inject insulin via insulin pen 6 x daily 200 each 3   OneTouch Delica Lancets 56O MISC Use as directed to monitor blood glucose up to 6x daily 200 each 11   glucose blood (FREESTYLE LITE) test strip Use to check blood sugar 6x per day (Patient not taking: Reported on 08/26/2022) 600 strip 1   Insulin Disposable Pump (OMNIPOD 5 G6 INTRO, GEN 5,) KIT Inject 1 kit into the skin as directed. . Change pod every 2 days. Intro kit comes with 2 boxes of pods, PDM device, pod pals, and user manual. Please fill for Omnipod 5 Into kit NDC [phone] (Patient not taking: Reported on 08/26/2022) 1 kit 2   Insulin Glargine (BASAGLAR KWIKPEN) 100 UNIT/ML Inject up to 50 units per day (Patient not taking: Reported on 08/26/2022) 45 mL 1   insulin lispro (HUMALOG KWIKPEN) 100 UNIT/ML KwikPen Inject up to 50 units per day (Patient not taking: Reported on 08/26/2022) 45 mL 3   No current facility-administered medications on file prior to visit.   Allergies: No Known Allergies  Surgical History: Past Surgical History:  Procedure Laterality Date   APPENDECTOMY     LAPAROSCOPIC APPENDECTOMY N/A 12/16/2013   Procedure: APPENDECTOMY LAPAROSCOPIC;  Surgeon: Jerilynn Mages. Gerald Stabs, MD;  Location: [facility];  Service: Pediatrics;  Laterality: N/A;   Family History:   Family History  Problem Relation Age of Onset   Kidney disease Paternal Grandmother    Kidney disease Paternal Grandfather    Hypertension Maternal Grandmother    Thyroid disease Maternal Grandfather    Healthy Mother    No strong family history of T2DM; only known family member with T2DM is paternal great grandmother.  No family history of T1DM.  MGF has hyperthyroidism, treated with oral medication (has never had surgery).  No other autoimmune diseases in the family.  Social History: Lives with: parents.  He is an only child Attending [employer] for  college; will travel this summer and transfer to a different college in the fall.  Physical Exam:  Vitals:   08/26/22 0814  BP: 120/70  Pulse: 87  Weight: 212 lb 3.2 oz (96.3 kg)  Height: 5' 10.2" (1.783 m)    Body mass index: body mass index is 30.28 kg/m. Growth %ile SmartLinks can only be used for patients less than 6 years old.   Wt Readings from Last 3 Encounters:  08/26/22 212 lb 3.2 oz (96.3 kg)  04/07/22 224 lb 6.4 oz (101.8 kg) (97 %, Z= 1.95)*  12/09/21 220 lb 6.4 oz (100 kg) (97 %, Z= 1.89)*   * Growth percentiles are based on CDC (Boys, 2-20 Years) data.   Ht Readings from Last 3 Encounters:  08/26/22 5' 10.2" (1.783 m)  04/07/22 5' 10.04" (1.779 m) (56 %, Z= 0.15)*  12/09/21 5' 9.8" (1.773 m) (53 %, Z= 0.08)*   * Growth percentiles are based on CDC (Boys, 2-20 Years) data.   Body mass index is 30.28 kg/m.  Facility age limit for growth %iles is 20 years. Facility age limit for growth %iles is 20 years.   General: Well developed, well nourished male in no acute distress.  Appears stated age Head: Normocephalic, atraumatic.   Eyes:  Pupils equal and round. EOMI.   Sclera white.  No eye drainage.   Ears/Nose/Mouth/Throat: Masked Neck: supple, no cervical lymphadenopathy, no thyromegaly Cardiovascular: regular rate, normal S1/S2, no murmurs Respiratory: No increased work of breathing.  Lungs clear to  auscultation bilaterally.  No wheezes. Abdomen: soft, nontender, nondistended.  Extremities: warm, well perfused, cap refill < 2 sec.   Musculoskeletal: Normal muscle mass.  Normal strength Skin: warm, dry.  No rash or lesions. Neurologic: alert and oriented, normal speech, no tremor   Laboratory Evaluation: Results for orders placed or performed in visit on 08/26/22  POCT glycosylated hemoglobin (Hb A1C)  Result Value Ref Range   Hemoglobin A1C     HbA1c POC (<> result, manual entry) 8.6 4.0 - 5.6 %   HbA1c, POC (prediabetic range)     HbA1c, POC (controlled diabetic range)    POCT Glucose (Device for Home Use)  Result Value Ref Range   Glucose Fasting, POC 111 (A) 70 - 99 mg/dL   POC Glucose      Last A1c 5.1% in 10/2015--> 5.4% in 12/2015-->5.7%-->6.0%-->6.8% 10/28/16-->8.2% 01/2017-->7.8% in 03/2017-->7.6% 08/2017-->8.8% in 11/2017-->8.6% 02/2018-->9.2% 06/2018--> 8.4% 09/2018--> 7.8% 03/2019--> 8.4% 07/2019--> 8.6% 10/2019--> 8% 03/2020--> 8.6% 07/2020--> 7.4% 10/2020--> 7.8% 09/24/21--> 7% 03/2022--> 8.6% 07/2022  Assessment/Plan: Luke [name] is a 21 y.o. male with T1DM on a pump (omnipod 5 using U-200) and CGM regimen.   A1c is higher than last visit and is above the ADA goal of <7.0%.  Dexcom tracing shows he is not meeting goal of TIR >70%. he needs more basal in manual mode and a stronger correction factor.    When a patient is on insulin, intensive monitoring of blood glucose levels and continuous insulin titration is vital to avoid insulin toxicity leading to severe hypoglycemia. Severe hypoglycemia can lead to seizure or death. Hyperglycemia can also result from inadequate insulin dosing and can lead to ketosis requiring ICU admission and intravenous insulin.   1. Type 1 diabetes with hyperglycemia - POCT Glucose and POCT HgB A1C as above -Will draw annual diabetes labs at next visit (lipid panel, TSH, FT4, urine microalbumin to creatinine ratio) -Encouraged to wear med alert ID every  day -Encouraged to rotate injection sites -  Provided with my contact information and advised to email/send mychart with questions/need for BG review -CGM download reviewed extensively (see interpretation above) -Rx sent to pharmacy include: lispro u-200 pens, G6 sensors and transmitter, baqsimi  2. Insulin pump titration -Made the following pump changes: Pump Settings: Basal Rates: Humalog U-200 12AM 0.8  8AM 0.7-->0.9  12PM 0.7-->0.9  2PM 0.65-->0.8  6PM 0.75--> 0.9  Total: 17.7 units daily--> 20.4   Insulin to carbohydrate ratio 12AM 14  6AM 10  10AM 12  2PM 14    Insulin Sensitivity Factor 12AM 60-->50   8AM 60-->50  11PM 60-->50              Target Blood Glucose 12AM 110                  Active insulin time 3 hours   If your pump breaks: Back-up lantus dose 35 units every 24 hours Humalog U-200 1 unit for every 12 carbs (carb ratio)  Humalog U-200 correction 1 unit for every 50 above 148m/dl (2023mdl at bedtime)   Please call [phone] questions     Follow-up:   Return in about 3 months (around 11/25/2022).   >40 minutes spent today reviewing the medical chart, counseling the patient/family, and documenting today's encounter.  AsLevon HedgerMD

## 2022-08-26 NOTE — Patient Instructions (Signed)
It was a pleasure to see you in clinic today.   Feel free to contact our office during normal business hours at [phone] with questions or concerns. If you have an emergency after normal business hours, please call the above number to reach our answering service who will contact the on-call pediatric endocrinologist.  If you choose to communicate with us via MyChart, please do not send urgent messages as this inbox is NOT monitored on nights or weekends.  Urgent concerns should be discussed with the on-call pediatric endocrinologist.  -Always have fast sugar with you in case of low blood sugar (glucose tabs, regular juice or soda, candy) -Always wear your ID that states you have diabetes -Always bring your meter/continuous glucose monitor to your visit -Call/Email if you want to review blood sugars  

## 2022-08-27 ENCOUNTER — Telehealth (INDEPENDENT_AMBULATORY_CARE_PROVIDER_SITE_OTHER): Payer: Self-pay

## 2022-08-27 NOTE — Telephone Encounter (Signed)
Fax received that pts PA for Omnipod 5 pods is expiring. PA initiated on covermymeds.   Key: [id number] - PA Case ID: [id number]

## 2022-09-06 NOTE — Telephone Encounter (Signed)
LATE DOCUMENTATION:   Omnipod 5 G6s Pods APPROVED through 08/28/2023.

## 2022-11-16 ENCOUNTER — Telehealth (INDEPENDENT_AMBULATORY_CARE_PROVIDER_SITE_OTHER): Payer: 59 | Admitting: Pediatrics

## 2022-11-16 ENCOUNTER — Encounter (INDEPENDENT_AMBULATORY_CARE_PROVIDER_SITE_OTHER): Payer: Self-pay | Admitting: Pediatrics

## 2022-11-16 DIAGNOSIS — Z4681 Encounter for fitting and adjustment of insulin pump: Secondary | ICD-10-CM

## 2022-11-16 DIAGNOSIS — E1065 Type 1 diabetes mellitus with hyperglycemia: Secondary | ICD-10-CM

## 2022-11-16 MED ORDER — OMNIPOD 5 DEXG7G6 PODS GEN 5 MISC: 11 refills | Status: DC

## 2022-11-16 NOTE — Patient Instructions (Signed)
It was a pleasure to see you in clinic today.   Feel free to contact our office during normal business hours at [phone] with questions or concerns. If you have an emergency after normal business hours, please call the above number to reach our answering service who will contact the on-call pediatric endocrinologist.  If you choose to communicate with us via MyChart, please do not send urgent messages as this inbox is NOT monitored on nights or weekends.  Urgent concerns should be discussed with the on-call pediatric endocrinologist.  -Always have fast sugar with you in case of low blood sugar (glucose tabs, regular juice or soda, candy) -Always wear your ID that states you have diabetes -Always bring your meter/continuous glucose monitor to your visit  Hypoglycemia  Shaking or trembling. Sweating and chills. Dizziness or lightheadedness. Faster heart rate. Headaches. Hunger. Nausea. Nervousness or irritability. Pale skin. Restless sleep. Weakness. Blurry vision. Confusion or trouble concentrating. Sleepiness. Slurred speech. Tingling or numbness in the face or mouth.  How do I treat an episode of hypoglycemia? The American Diabetes Association recommends the "15-15 rule" for an episode of hypoglycemia: Eat or drink 15 grams of fast-acting carbs (4oz regular soda or juice, 1 pkg fruit snacks, 4 glucose tabs) to raise your blood sugar. After 15 minutes, check your blood sugar. If it's still below 80 mg/dL, have another 15 grams of fast-acting carbs. Repeat until your blood sugar is at least 80 mg/dL.  Hyperglycemia  Frequent urination Increased thirst Blurred vision Fatigue Headache          Diabetic Ketoacidosis (DKA)  If hyperglycemia goes untreated, it can cause toxic acids (ketones) to build up in your blood and urine (ketoacidosis). Signs and symptoms include: Fruity-smelling breath Nausea and vomiting Shortness of breath Dry  mouth Weakness Confusion Coma Abdominal pain  Sick day/Ketones Protocol  Check blood glucose every 3 hours  Give rapid acting insulin correction dose           every 3 hours until ketones are negative Check urine ketones every 2 hours (until ketones are negative)  Drink plenty of fluids (water, Pedialyte) every hour Notify clinic of sickness/ketones  If you develop signs of DKA (especially vomiting or abdominal pain and inability to drink), go to [facility] immediately.   Hemoglobin A1c levels      

## 2022-11-16 NOTE — Progress Notes (Signed)
Luke [name] scheduled a video visit today though he was unable to turn his camera on, so I was unable to perform visit.   He did have several questions: He will be traveling abroad for 2 months over the summer (leaving June 4).  He needs a note to carry supplies on the plane. He is wondering how to keep his insulin cold over 2 months (will be staying in hostels without access to consistent refrigeration).  Wondering how to carry his insulin during this time. Is only getting 2 boxes per month of omnipod 5 pods; in the past was getting 3 boxes per month so he can change them every other day.  Will [facility] updated rx to [facility] [address]  I will put a travel letter in his mychart account.  If he cannot access it, mom can come and pick up a physical copy.   Will check with my pharmacist to determine how to best transport insulin over a 2 month time span.  Will send an updated rx to his pharmacy.  Advised to call to reschedule apt

## 2022-11-30 ENCOUNTER — Encounter (INDEPENDENT_AMBULATORY_CARE_PROVIDER_SITE_OTHER): Payer: Self-pay | Admitting: Pharmacist

## 2022-11-30 ENCOUNTER — Telehealth (INDEPENDENT_AMBULATORY_CARE_PROVIDER_SITE_OTHER): Payer: Self-pay | Admitting: Pharmacist

## 2022-11-30 NOTE — Telephone Encounter (Signed)
Called patient on 11/30/2022 at 3:32 PM at [phone] . Unable to leave HIPAA-compliant voicemail with instructions to call [facility] back.  Plan to discuss traveling guidance with insulin while traveling in [place] this summer.   It appears patient uses Mychart so will send him a Mychart with this information while I await for patient to return call.  Thank you for involving pharmacy/diabetes educator to assist in providing this patient's care.   Drexel Iha, PharmD, BCACP, [place], CPP

## 2023-01-11 ENCOUNTER — Telehealth (INDEPENDENT_AMBULATORY_CARE_PROVIDER_SITE_OTHER): Payer: Self-pay | Admitting: Pediatrics

## 2023-01-11 DIAGNOSIS — E1065 Type 1 diabetes mellitus with hyperglycemia: Secondary | ICD-10-CM

## 2023-01-11 NOTE — Telephone Encounter (Signed)
Who's calling (name and relationship to patient) :Jeri Modena-  self    Best contact [phone]  Provider they ZOX:WRUEAV   Reason for call:Kutter stated that he called the pharmacy earlier to get a refill on his Dexcom sensors and was told that PA from our office was needed.   Call ID:      PRESCRIPTION REFILL ONLY  Name of prescription:Dexcom G6 Sensor  Pharmacy: [facility] [place]- [address]

## 2023-01-12 MED ORDER — DEXCOM G6 SENSOR MISC: 11 refills | Status: DC

## 2023-01-12 NOTE — Addendum Note (Signed)
Addended by: Pollie Friar D on: 01/12/2023 12:06 PM   Modules accepted: Orders

## 2023-01-12 NOTE — Telephone Encounter (Signed)
Mom has called in returning a call from [place]. She is requesting a call back.  Ph: [phone]

## 2023-01-12 NOTE — Telephone Encounter (Addendum)
APPROVED. Called and spoke to pts mom. Calmed her down because she was freaking out because she thought the pt might not get approved for the Dexcom G6 Sensors. I reassured her that we have to do this every year and that I just checked and it was approved. Verified pharmacy to send refill too and next appt for pt. Mom had no further concerns.

## 2023-01-18 ENCOUNTER — Encounter (INDEPENDENT_AMBULATORY_CARE_PROVIDER_SITE_OTHER): Payer: Self-pay | Admitting: Pediatrics

## 2023-01-18 DIAGNOSIS — R7309 Other abnormal glucose: Secondary | ICD-10-CM

## 2023-01-21 NOTE — Telephone Encounter (Signed)
On 01/20/23 in afternoon- Called [employer] to get vacation override on quantity exceptions for pts Dexcom G6 Sensors [phone]) and Omnipod's ([id number]).   01/21/23 Mom called office to see if I had started these. I gave her an update, but I had to clarify which insulin pt would need. Mom stated it would be the Humalog 200 qwikpen. I told mom to wait to get these picked up until I had an update when I called back to PA the Humalog qwikpen. She stated understanding and had no further questions.

## 2023-01-21 NOTE — Telephone Encounter (Signed)
Talked to pt and he stated that he is leaving for his trip on June 4th. And listed what he will need for his trip:  -omnipods [phone]) initiated thru insurance -dexcom sensors [id number]) initiated thru insurance  -humalog U-200 Stephanie Coup -basaglar [employer] -dexcom transmitter -baqsimi  -needles -freestyle test strips   [employer] and attempted vacation override to above medications. They suggested me to call pharmacy first to see if we can switch everything to a 90 day supply, then see if things come back rejected, and then if they come back rejected the pharmacy will need to call them to get that override.   I called pharmacy and asked them to switch all of the above to 90 day supply, and that pt is going on a vacation and will need them asap as he is leaving June 3rd. I also told them if they get a rejection to call the insurance for the override.

## 2023-01-25 ENCOUNTER — Telehealth (INDEPENDENT_AMBULATORY_CARE_PROVIDER_SITE_OTHER): Payer: Self-pay | Admitting: Pediatrics

## 2023-01-25 MED ORDER — INSUPEN PEN NEEDLES 32G X 4 MM MISC: 1 refills | Status: AC

## 2023-01-25 NOTE — Telephone Encounter (Signed)
See mychart message thread for update

## 2023-01-25 NOTE — Telephone Encounter (Signed)
  Name of who is calling: Orla Allred contact number: [phone]  Provider they see: Patient called and wanted to touch base on his message he sent through my chart.

## 2023-01-25 NOTE — Telephone Encounter (Signed)
Called number patient provided for override.  The status of the Omnipod PA has been denied.

## 2023-01-25 NOTE — Telephone Encounter (Signed)
Pt is calling to let you know the pharmacy said It will take up to 72 hours and he is concerned he wont have it in time for vacation.

## 2023-01-25 NOTE — Addendum Note (Signed)
Addended by: Angelene Giovanni A on: 01/25/2023 04:36 PM   Modules accepted: Orders

## 2023-01-26 ENCOUNTER — Other Ambulatory Visit (INDEPENDENT_AMBULATORY_CARE_PROVIDER_SITE_OTHER): Payer: Self-pay | Admitting: Pediatrics

## 2023-01-26 ENCOUNTER — Telehealth (INDEPENDENT_AMBULATORY_CARE_PROVIDER_SITE_OTHER): Payer: Self-pay | Admitting: Pediatrics

## 2023-01-26 DIAGNOSIS — E1065 Type 1 diabetes mellitus with hyperglycemia: Secondary | ICD-10-CM

## 2023-01-26 MED ORDER — OMNIPOD 5 DEXG7G6 PODS GEN 5 MISC: 3 refills | Status: DC

## 2023-01-26 NOTE — Telephone Encounter (Signed)
Being handled in a different encouter

## 2023-01-26 NOTE — Telephone Encounter (Signed)
Who's calling (name and relationship to patient) : Luke [name]   Best contact number:[phone]   Provider they ZOX:WRUEAV   Reason for call: [name] called in stating that there has been a issue with Luke [name]'s pods and she believes the reason they are receiving a rejection code 73 is because of a error entering the quantity on this side. [name] also stated that [facility] sent a message and that the insurance rejected Omnipod and she thinks it may have just been a issue. [name] is requesting a call back.    Call ID:      PRESCRIPTION REFILL ONLY  Name of prescription:  Pharmacy:

## 2023-01-26 NOTE — Telephone Encounter (Signed)
Fax received from [facility] stating Omnipods DENIED stating they will only allow 10 pods (2 boxes) per 30 days.     Fax received from [facility] stating Dexcom G6 Sensors Previously Approved thru 01/12/2024.

## 2023-01-28 ENCOUNTER — Other Ambulatory Visit (INDEPENDENT_AMBULATORY_CARE_PROVIDER_SITE_OTHER): Payer: Self-pay

## 2023-01-28 DIAGNOSIS — E1065 Type 1 diabetes mellitus with hyperglycemia: Secondary | ICD-10-CM

## 2023-01-28 MED ORDER — OMNIPOD 5 DEXG7G6 PODS GEN 5 MISC: 3 refills | Status: DC

## 2023-03-04 ENCOUNTER — Encounter (INDEPENDENT_AMBULATORY_CARE_PROVIDER_SITE_OTHER): Payer: Self-pay

## 2023-03-17 ENCOUNTER — Ambulatory Visit (INDEPENDENT_AMBULATORY_CARE_PROVIDER_SITE_OTHER): Payer: 59 | Admitting: Pediatrics

## 2023-03-17 ENCOUNTER — Encounter (INDEPENDENT_AMBULATORY_CARE_PROVIDER_SITE_OTHER): Payer: Self-pay

## 2023-03-17 NOTE — Progress Notes (Deleted)
Pediatric Endocrinology Consultation Follow-up Visit  Chief Complaint: Type 1 diabetes  HPI: Luke [name] is a 22 y.o. male presenting for follow-up of the above concerns.  he is accompanied to this visit by his ***mother.     1. Luke [name] was initially referred to [facility] in 05/2015 for concerns of new onset diabetes.  He was seen by his PCP on 06/10/2015 for a well child check.  At that visit, blood work was obtained due to him being overweight including a random blood sugar that was elevated to 297 (this was obtained 3-4 hours after eating a cheeseburger, potato wedges, apple slices, chocolate milk, a can of pineapple soda, and a cupcake with frosting).  A lipid panel was also obtained 06/10/15 showing total cholesterol 199, HDL 31, LDL 109.  He was called back to PCP's office on 06/11/2015 for fasting blood work (fingerstick glucose 155, 146 on CMP), UA (negative for ketones and glucose), A1c 7.7%.  CMP normal except elevated glucose (including normal BUN /Cr and AST/ALT).  TSH slightly elevated at 5.349 (0.4-5) with normal FT4 of 0.93.  He was initially seen at [facility] on 06/13/15 where A1c was 7.4%, GAD Ab and insulin Ab were positive and C-peptide was normal and he was diagnosed with evolving T1DM. He was started on metformin at that visit prior to Ab results.  Metformin was discontinued in 07/2015 after several blood sugars were in the 60s.  He also had normal TFTs and celiac screen in 07/2015.  A1c gradually increased and blood sugars started becoming elevated so he was started on rapid-acting insulin in 10/2016 and started on long-acting insulin within the next week.  He started on an omnipod pump in 02/2017.  He started on a dexcom CGM in 07/2018.   He transitioned to omnipod 5 in 11/2021.  2. Since last visit on 08/26/22, he has been ***well.   ED visits/Hospitalizations for diabetes: None***  Concerns:  -***  Insulin regimen: Humalog U-200 in omnipod pump*** Basal Rates: Humalog U-200 12AM 0.8   8AM 0.9  12PM 0.9  2PM 0.8  6PM  0.9  Total: 20.4 units daily   Insulin to carbohydrate ratio-Humalog U-200 12AM 14  6AM 10  10AM 12  2PM 14    Insulin Sensitivity Factor-Humalog U-200 12AM 50   8AM 50  11PM 50              Target Blood Glucose 12AM 110                      Active insulin time 3 hours    BG/Pump download:   *** *** Interpretation: ***  Hypoglycemia:  ***Able to feel lows.  No glucagon needed recently.  Wearing Med-alert ID currently: ***Not currently. Reminded to wear one. Injection sites: abdominal wall and arm(s)*** Annual labs due: 08/2022-Due now*** Ophthalmology due: last eye exam 12/2020; no concerns per mom. Reminded to schedule a dilated eye exam***  ROS: All systems reviewed with pertinent positives listed below; otherwise negative. Constitutional: Weight has {Increased/Decreased:28853} ***lb since last visit.       Past Medical History:  Past Medical History:  Diagnosis Date   Diabetes mellitus without complication (HCC)    Elevated hemoglobin A1c    05/2015 A1c 7.7% with fasting hyperglycemia.  + GAD ab and + insulin Ab    Meds: Current Outpatient Medications on File Prior to Visit  Medication Sig Dispense Refill   Blood Glucose Monitoring Suppl (ONETOUCH VERIO) w/Device KIT Use 1 kit  as directed to monitor blood glucose up to 6x daily 1 kit 1   Continuous Blood Gluc Transmit (DEXCOM G6 TRANSMITTER) MISC Use with Dexcom sensor, reuse for 3 months 1 each 1   Continuous Glucose Sensor (DEXCOM G6 SENSOR) MISC CHANGE SENSOR EVERY 10 DAYS 3 each 11   Glucagon (BAQSIMI TWO PACK) 3 MG/DOSE POWD Place 1 application  into the nose as needed. Use as directed if unconscious, unable to take food po, or having a seizure due to hypoglycemia 2 each 1   glucose blood (FREESTYLE LITE) test strip Use to check blood sugar 6x per day (Patient not taking: Reported on 08/26/2022) 600 strip 1   glucose blood test strip Use as directed to monitor blood  glucose up to 6x daily 200 each 11   Insulin Disposable Pump (OMNIPOD 5 G6 PODS, GEN 5,) MISC Inject 1 Device into the skin as directed. Change pod every 3 days. Patient will need 30 pods for a 90 day supply. Please fill for [facility] [address]21. 30 each 3   Insulin Glargine (BASAGLAR KWIKPEN) 100 UNIT/ML Inject up to 50 units per day (Patient not taking: Reported on 08/26/2022) 45 mL 1   insulin lispro (HUMALOG KWIKPEN) 100 UNIT/ML KwikPen Inject up to 50 units per day (Patient not taking: Reported on 08/26/2022) 45 mL 3   insulin lispro (HUMALOG KWIKPEN) 200 UNIT/ML KwikPen Inject 200 units of U-200 into insulin pump every 2-3 days (total daily dose 120 units daily of U-100 or 60 units daily U-200) 18 mL 6   insulin lispro (HUMALOG) 100 UNIT/ML injection 200 UNITS OF INSULIN IN PUMP EVERY 48 HOURS 40 mL 5   Insulin Pen Needle (INSUPEN PEN NEEDLES) 32G X 4 MM MISC Inject insulin via insulin pen 6 x daily 600 each 1   OneTouch Delica Lancets 33G MISC Use as directed to monitor blood glucose up to 6x daily 200 each 11   No current facility-administered medications on file prior to visit.   Allergies: No Known Allergies  Surgical History: Past Surgical History:  Procedure Laterality Date   APPENDECTOMY     LAPAROSCOPIC APPENDECTOMY N/A 12/16/2013   Procedure: APPENDECTOMY LAPAROSCOPIC;  Surgeon: Judie Petit. Leonia Corona, MD;  Location: [facility] OR;  Service: Pediatrics;  Laterality: N/A;   Family History:  Family History  Problem Relation Age of Onset   Kidney disease Paternal Grandmother    Kidney disease Paternal Grandfather    Hypertension Maternal Grandmother    Thyroid disease Maternal Grandfather    Healthy Mother    No strong family history of T2DM; only known family member with T2DM is paternal great grandmother.  No family history of T1DM.  MGF has hyperthyroidism, treated with oral medication (has never had surgery).  No other autoimmune diseases in the family.  Social History: Lives with:  parents.  He is an only child ***  Physical Exam:  There were no vitals filed for this visit.   Body mass index: body mass index is unknown because there is no height or weight on file. Growth %ile SmartLinks can only be used for patients less than 52 years old.   Wt Readings from Last 3 Encounters:  08/26/22 212 lb 3.2 oz (96.3 kg)  04/07/22 224 lb 6.4 oz (101.8 kg) (97%, Z= 1.95)*  12/09/21 220 lb 6.4 oz (100 kg) (97%, Z= 1.89)*   * Growth percentiles are based on CDC (Boys, 2-20 Years) data.   Ht Readings from Last 3 Encounters:  08/26/22 5' 10.2" (1.783 m)  04/07/22 5' 10.04" (1.779 m) (56%, Z= 0.15)*  12/09/21 5' 9.8" (1.773 m) (53%, Z= 0.08)*   * Growth percentiles are based on CDC (Boys, 2-20 Years) data.   There is no height or weight on file to calculate BMI.  Facility age limit for growth %iles is 20 years. Facility age limit for growth %iles is 20 years.   General: Well developed, well nourished male in no acute distress.  Appears *** stated age Head: Normocephalic, atraumatic.   Eyes:  Pupils equal and round. EOMI.   Sclera white.  No eye drainage.   Ears/Nose/Mouth/Throat: Nares patent, no nasal drainage.  Moist mucous membranes, normal dentition Neck: supple, no cervical lymphadenopathy, no thyromegaly Cardiovascular: regular rate, normal S1/S2, no murmurs Respiratory: No increased work of breathing.  Lungs clear to auscultation bilaterally.  No wheezes. Abdomen: soft, nontender, nondistended.  Extremities: warm, well perfused, cap refill < 2 sec.   Musculoskeletal: Normal muscle mass.  Normal strength Skin: warm, dry.  No rash or lesions. Neurologic: alert and oriented, normal speech, no tremor   Laboratory Evaluation: Results for orders placed or performed in visit on 08/26/22  POCT glycosylated hemoglobin (Hb A1C)  Result Value Ref Range   Hemoglobin A1C     HbA1c POC (<> result, manual entry) 8.6 4.0 - 5.6 %   HbA1c, POC (prediabetic range)      HbA1c, POC (controlled diabetic range)    POCT Glucose (Device for Home Use)  Result Value Ref Range   Glucose Fasting, POC 111 (A) 70 - 99 mg/dL   POC Glucose      Last A1c 5.1% in 10/2015--> 5.4% in 12/2015-->5.7%-->6.0%-->6.8% 10/28/16-->8.2% 01/2017-->7.8% in 03/2017-->7.6% 08/2017-->8.8% in 11/2017-->8.6% 02/2018-->9.2% 06/2018--> 8.4% 09/2018--> 7.8% 03/2019--> 8.4% 07/2019--> 8.6% 10/2019--> 8% 03/2020--> 8.6% 07/2020--> 7.4% 10/2020--> 7.8% 09/24/21--> 7% 03/2022--> 8.6% 07/2022-->***% 02/2023  Assessment/Plan: Melbert Botelho is a 22 y.o. male with T1DM on a pump ({abjpumps:29736::"Tandem Tslim X2"}) and CGM regimen.   A1c is {higher/lower:18993} than last visit  The ADA goal for A1c is <7.0%.   Dexcom tracing shows he {ACTION; IS/IS QMV:[id number]} meeting goal of TIR >70%.  he needs {abjmoreinsulin:29737}  When a patient is on insulin, intensive monitoring of blood glucose levels and continuous insulin titration is vital to avoid insulin toxicity leading to severe hypoglycemia. Severe hypoglycemia can lead to seizure or death. Hyperglycemia can also result from inadequate insulin dosing and can lead to ketosis requiring ICU admission and intravenous insulin.   1. Type 1 diabetes with hyperglycemia {abjT1DMplan:29739::"-POC glucose and A1c as above","-CGM download reviewed extensively (see interpretation above)","--Encouraged to wear med alert ID every day","-Encouraged to rotate injection sites","-Provided with my contact information and advised to email/send mychart with questions/need for BG review"}  2. Insulin Pump {abjinsulinpump in place:29738} -Made the following pump changes: *** Pump Settings: Basal Rates: Humalog U-200 12AM 0.8  8AM 0.7-->0.9  12PM 0.7-->0.9  2PM 0.65-->0.8  6PM 0.75--> 0.9  Total: 17.7 units daily--> 20.4   Insulin to carbohydrate ratio 12AM 14  6AM 10  10AM 12  2PM 14    Insulin Sensitivity Factor 12AM 60-->50   8AM 60-->50  11PM 60-->50               Target Blood Glucose 12AM 110                  Active insulin time 3 hours   If your pump breaks: Back-up lantus dose 35 units every 24 hours Humalog U-200 1 unit for every 12  carbs (carb ratio)  Humalog U-200 correction 1 unit for every 50 above 150mg /dl (200mg /dl at bedtime)   Please call [phone] with questions   Follow-up:   No follow-ups on file.   ***  Casimiro Needle, MD

## 2023-06-02 ENCOUNTER — Ambulatory Visit (INDEPENDENT_AMBULATORY_CARE_PROVIDER_SITE_OTHER): Payer: 59 | Admitting: Pediatrics

## 2023-06-02 ENCOUNTER — Encounter (INDEPENDENT_AMBULATORY_CARE_PROVIDER_SITE_OTHER): Payer: Self-pay | Admitting: Pediatrics

## 2023-06-02 VITALS — BP 118/74 | HR 80 | Wt 211.2 lb

## 2023-06-02 DIAGNOSIS — E1065 Type 1 diabetes mellitus with hyperglycemia: Secondary | ICD-10-CM

## 2023-06-02 DIAGNOSIS — Z4681 Encounter for fitting and adjustment of insulin pump: Secondary | ICD-10-CM | POA: Diagnosis not present

## 2023-06-02 DIAGNOSIS — Z23 Encounter for immunization: Secondary | ICD-10-CM | POA: Diagnosis not present

## 2023-06-02 LAB — POCT GLYCOSYLATED HEMOGLOBIN (HGB A1C): Hemoglobin A1C: 8 % — AB (ref 4.0–5.6)

## 2023-06-02 LAB — POCT GLUCOSE (DEVICE FOR HOME USE): Glucose Fasting, POC: 125 mg/dL — AB (ref 70–99)

## 2023-06-02 NOTE — Patient Instructions (Signed)
It was a pleasure to see you in clinic today.   Feel free to contact our office during normal business hours at [phone] with questions or concerns. If you have an emergency after normal business hours, please call the above number to reach our answering service who will contact the on-call pediatric endocrinologist.  If you choose to communicate with us via MyChart, please do not send urgent messages as this inbox is NOT monitored on nights or weekends.  Urgent concerns should be discussed with the on-call pediatric endocrinologist.  -Always have fast sugar with you in case of low blood sugar (glucose tabs, regular juice or soda, candy) -Always wear your ID that states you have diabetes -Always bring your meter/continuous glucose monitor to your visit  Hypoglycemia  Shaking or trembling. Sweating and chills. Dizziness or lightheadedness. Faster heart rate. Headaches. Hunger. Nausea. Nervousness or irritability. Pale skin. Restless sleep. Weakness. Blurry vision. Confusion or trouble concentrating. Sleepiness. Slurred speech. Tingling or numbness in the face or mouth.  How do I treat an episode of hypoglycemia? The American Diabetes Association recommends the "15-15 rule" for an episode of hypoglycemia: Eat or drink 15 grams of fast-acting carbs (4oz regular soda or juice, 1 pkg fruit snacks, 4 glucose tabs) to raise your blood sugar. After 15 minutes, check your blood sugar. If it's still below 80 mg/dL, have another 15 grams of fast-acting carbs. Repeat until your blood sugar is at least 80 mg/dL.  Hyperglycemia  Frequent urination Increased thirst Blurred vision Fatigue Headache          Diabetic Ketoacidosis (DKA)  If hyperglycemia goes untreated, it can cause toxic acids (ketones) to build up in your blood and urine (ketoacidosis). Signs and symptoms include: Fruity-smelling breath Nausea and vomiting Shortness of breath Dry  mouth Weakness Confusion Coma Abdominal pain  Sick day/Ketones Protocol  Check blood glucose every 3 hours  Give rapid acting insulin correction dose           every 3 hours until ketones are negative Check urine ketones every 2 hours (until ketones are negative)  Drink plenty of fluids (water, Pedialyte) every hour Notify clinic of sickness/ketones  If you develop signs of DKA (especially vomiting or abdominal pain and inability to drink), go to [facility] immediately.   Hemoglobin A1c levels      
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
## 2023-07-19 ENCOUNTER — Other Ambulatory Visit (INDEPENDENT_AMBULATORY_CARE_PROVIDER_SITE_OTHER): Payer: Self-pay | Admitting: Pediatrics

## 2023-07-19 DIAGNOSIS — E1065 Type 1 diabetes mellitus with hyperglycemia: Secondary | ICD-10-CM

## 2023-08-01 ENCOUNTER — Telehealth (INDEPENDENT_AMBULATORY_CARE_PROVIDER_SITE_OTHER): Payer: Self-pay | Admitting: Pediatrics

## 2023-08-01 DIAGNOSIS — E1065 Type 1 diabetes mellitus with hyperglycemia: Secondary | ICD-10-CM

## 2023-08-01 MED ORDER — DEXCOM G6 TRANSMITTER MISC: 1 refills | Status: DC

## 2023-08-01 NOTE — Telephone Encounter (Signed)
  Name of who is calling: Marcelene Butte  Caller's Relationship to Patient: Self  Best contact number: [phone]  Provider they see: Dr. Larinda Buttery  Reason for call: Pt said he just got off the phone with his insurance company and they are requesting Dr. Larinda Buttery send in a new prescription to pharmacy.      PRESCRIPTION REFILL ONLY  Name of prescription: Dexcom G6 Transmitter   Pharmacy: [facility] [address]

## 2023-08-01 NOTE — Telephone Encounter (Signed)
Refill sent to pharmacy.   

## 2023-09-09 ENCOUNTER — Telehealth (INDEPENDENT_AMBULATORY_CARE_PROVIDER_SITE_OTHER): Payer: Self-pay | Admitting: Pediatrics

## 2023-09-09 NOTE — Telephone Encounter (Signed)
  Name of who is calling: Jodey   Caller's Relationship to Patient:   Best contact number: [phone]  Provider they see: willo  Reason for call: would like to know if appt scheduled could be virtual, and they also have some questions about driving paperwork.      PRESCRIPTION REFILL ONLY  Name of prescription:  Pharmacy:

## 2023-09-09 NOTE — Telephone Encounter (Signed)
 Called patient back to update that I can't make the decision for Dr. Willo as far as him being virtual or in person.  He stated that he can make it in person, it is not a problem.  I did tell him to discuss with Dr. Willo about future appts being virtual or in person as he is currently living 3 hours away.  He may need to her to order labwork for his A1C.  He is also due to renew his license and asked about the paperwork.  He has not received any from [facility], I suggested that he go to [facility] website, print off the  form and fill out his portion.  He can bring to the appt or send it via mychart.  He verbalized understanding.

## 2023-09-10 LAB — COMPLETE METABOLIC PANEL WITH GFR
AG Ratio: 1.7 (calc) (ref 1.0–2.5)
ALT: 34 U/L (ref 9–46)
AST: 20 U/L (ref 10–40)
Albumin: 4.3 g/dL (ref 3.6–5.1)
Alkaline phosphatase (APISO): 68 U/L (ref 36–130)
BUN: 11 mg/dL (ref 7–25)
CO2: 29 mmol/L (ref 20–32)
Calcium: 9.5 mg/dL (ref 8.6–10.3)
Chloride: 100 mmol/L (ref 98–110)
Creat: 0.84 mg/dL (ref 0.60–1.24)
Globulin: 2.5 g/dL (ref 1.9–3.7)
Glucose, Bld: 217 mg/dL — ABNORMAL HIGH (ref 65–139)
Potassium: 4.6 mmol/L (ref 3.5–5.3)
Sodium: 139 mmol/L (ref 135–146)
Total Bilirubin: 0.5 mg/dL (ref 0.2–1.2)
Total Protein: 6.8 g/dL (ref 6.1–8.1)
eGFR: 127 mL/min/{1.73_m2} (ref >=60)

## 2023-09-10 LAB — TSH: TSH: 2.72 m[IU]/L (ref 0.40–4.50)

## 2023-09-10 LAB — LIPID PANEL
Cholesterol: 190 mg/dL (ref <200)
HDL: 43 mg/dL (ref >=40)
LDL Cholesterol (Calc): 116 mg/dL — ABNORMAL HIGH
Non-HDL Cholesterol (Calc): 147 mg/dL — ABNORMAL HIGH (ref <130)
Total CHOL/HDL Ratio: 4.4 (calc) (ref <5.0)
Triglycerides: 192 mg/dL — ABNORMAL HIGH (ref <150)

## 2023-09-10 LAB — MICROALBUMIN / CREATININE URINE RATIO
Creatinine, Urine: 111 mg/dL (ref 20–320)
Microalb Creat Ratio: 3 mg/g{creat} (ref <30)
Microalb, Ur: 0.3 mg/dL

## 2023-09-10 LAB — T4, FREE: Free T4: 1.2 ng/dL (ref 0.8–1.8)

## 2023-09-13 ENCOUNTER — Encounter (INDEPENDENT_AMBULATORY_CARE_PROVIDER_SITE_OTHER): Payer: Self-pay | Admitting: Pediatrics

## 2023-09-13 ENCOUNTER — Ambulatory Visit (INDEPENDENT_AMBULATORY_CARE_PROVIDER_SITE_OTHER): Payer: 59 | Admitting: Pediatrics

## 2023-09-13 VITALS — BP 122/80 | HR 80 | Ht 70.04 in | Wt 219.0 lb

## 2023-09-13 DIAGNOSIS — E1065 Type 1 diabetes mellitus with hyperglycemia: Secondary | ICD-10-CM | POA: Diagnosis not present

## 2023-09-13 DIAGNOSIS — Z4681 Encounter for fitting and adjustment of insulin pump: Secondary | ICD-10-CM

## 2023-09-13 LAB — POCT GLUCOSE (DEVICE FOR HOME USE): Glucose Fasting, POC: 290 mg/dL — AB (ref 70–99)

## 2023-09-13 LAB — POCT GLYCOSYLATED HEMOGLOBIN (HGB A1C): HbA1c, POC (controlled diabetic range): 8 % — AB (ref 0.0–7.0)

## 2023-09-13 NOTE — Patient Instructions (Signed)
 It was a pleasure to see you in clinic today.   Feel free to contact our office during normal business hours at [phone] with questions or concerns. If you have an emergency after normal business hours, please call the above number to reach our answering service who will contact the on-call pediatric endocrinologist.  If you choose to communicate with [place] via MyChart, please do not send urgent messages as this inbox is NOT monitored on nights or weekends.  Urgent concerns should be discussed with the on-call pediatric endocrinologist.  -Always have fast sugar with you in case of low blood sugar (glucose tabs, regular juice or soda, candy) -Always wear your ID that states you have diabetes -Always bring your meter/continuous glucose monitor to your visit  Hypoglycemia  Shaking or trembling. Sweating and chills. Dizziness or lightheadedness. Faster heart rate. Headaches. Hunger. Nausea. Nervousness or irritability. Pale skin. Restless sleep. Weakness. Blurry vision. Confusion or trouble concentrating. Sleepiness. Slurred speech. Tingling or numbness in the face or mouth.  How do I treat an episode of hypoglycemia? The American Diabetes Association recommends the "15-15 rule" for an episode of hypoglycemia: Eat or drink 15 grams of fast-acting carbs (4oz regular soda or juice, 1 pkg fruit snacks, 4 glucose tabs) to raise your blood sugar. After 15 minutes, check your blood sugar. If it's still below 80 mg/dL, have another 15 grams of fast-acting carbs. Repeat until your blood sugar is at least 80 mg/dL.  Hyperglycemia  Frequent urination Increased thirst Blurred vision Fatigue Headache          Diabetic Ketoacidosis (DKA)  If hyperglycemia goes untreated, it can cause toxic acids (ketones) to build up in your blood and urine (ketoacidosis). Signs and symptoms include: Fruity-smelling breath Nausea and vomiting Shortness of breath Dry  mouth Weakness Confusion Coma Abdominal pain  Sick day/Ketones Protocol  Check blood glucose every 3 hours  Give rapid acting insulin correction dose           every 3 hours until ketones are negative Check urine ketones every 2 hours (until ketones are negative)  Drink plenty of fluids (water, Pedialyte) every hour Notify clinic of sickness/ketones  If you develop signs of DKA (especially vomiting or abdominal pain and inability to drink), go to [facility] room immediately.   Hemoglobin A1c levels

## 2023-09-13 NOTE — Progress Notes (Signed)
 Pediatric Endocrinology Consultation Follow-up Visit  Chief Complaint: Type 1 diabetes  HPI: Luke [name] is a 23 y.o. male presenting for follow-up of the above concerns.  he attended this visit alone.  1. Luke [name] was initially referred to [facility] in 05/2015 for concerns of new onset diabetes.  He was seen by his PCP on 06/10/2015 for a well child check.  At that visit, blood work was obtained due to him being overweight including a random blood sugar that was elevated to 297 (this was obtained 3-4 hours after eating a cheeseburger, potato wedges, apple slices, chocolate milk, a can of pineapple soda, and a cupcake with frosting).  A lipid panel was also obtained 06/10/15 showing total cholesterol 199, HDL 31, LDL 109.  He was called back to PCP's office on 06/11/2015 for fasting blood work (fingerstick glucose 155, 146 on CMP), UA (negative for ketones and glucose), A1c 7.7%.  CMP normal except elevated glucose (including normal BUN /Cr and AST/ALT).  TSH slightly elevated at 5.349 (0.4-5) with normal FT4 of 0.93.  He was initially seen at [facility] on 06/13/15 where A1c was 7.4%, GAD Ab and insulin  Ab were positive and C-peptide was normal and he was diagnosed with evolving T1DM. He was started on metformin  at that visit prior to Ab results.  Metformin  was discontinued in 07/2015 after several blood sugars were in the 60s.  He also had normal TFTs and celiac screen in 07/2015.  A1c gradually increased and blood sugars started becoming elevated so he was started on rapid-acting insulin  in 10/2016 and started on long-acting insulin  within the next week.  He started on an omnipod pump in 02/2017.  He started on a dexcom CGM in 07/2018.   He transitioned to omnipod 5 in 11/2021.  2. Since last visit on 06/02/23, he has been well.   ED visits/Hospitalizations for diabetes: None  Concerns:  -working to keep pump in auto mode  Insulin  regimen: Humalog  U-200 in omnipod 5 pump  Basal Rates: (humalog  U-200) 12AM  0.8  8AM 0.9  12PM 0.9  2PM 0.8  6PM 0.9  Total: 20.4 units daily   Insulin  to carbohydrate ratio  (humalog  U-200) 12AM 14  6AM 10  10AM 12  2PM 14    Insulin  Sensitivity Factor  (humalog  U-200) 12AM 50   8AM 50  11PM 50              Target Blood Glucose  (humalog  U-200) 12AM 110                  Active insulin  time 3 hours   BG/Pump/CGM download:       Interpretation: Needs more for carbs   Hypoglycemia:  Able to feel lows.  No glucagon  needed recently.  Wearing Med-alert ID currently: Wearing intermittently. Reminded to wear one at all times.  Injection sites: abdominal wall and arm(s) Annual labs due: Drawn 09/09/23; labs unremarkable Eye exam:  Due now.  Reminded to schedule.    ROS: All systems reviewed with pertinent positives listed below; otherwise negative. Constitutional: Weight has increased 8lb since last visit.  Eating well.   Past Medical History:  Past Medical History:  Diagnosis Date   Diabetes mellitus without complication (HCC)    Elevated hemoglobin A1c    05/2015 A1c 7.7% with fasting hyperglycemia.  + GAD ab and + insulin  Ab   Meds: Current Outpatient Medications on File Prior to Visit  Medication Sig Dispense Refill   Continuous Glucose Sensor (DEXCOM G6  SENSOR) MISC CHANGE SENSOR EVERY 10 DAYS 3 each 11   Continuous Glucose Transmitter (DEXCOM G6 TRANSMITTER) MISC Use with Dexcom sensor, reuse for 3 months 1 each 1   Glucagon  (BAQSIMI  TWO PACK) 3 MG/DOSE POWD Place 1 application  into the nose as needed. Use as directed if unconscious, unable to take food po, or having a seizure due to hypoglycemia 2 each 1   Insulin  Disposable Pump (OMNIPOD 5 G6 PODS, GEN 5,) MISC Inject 1 Device into the skin as directed. Change pod every 3 days. Patient will need 30 pods for a 90 day supply. Please fill for [facility] [id number]. 30 each 3   insulin  lispro (HUMALOG  KWIKPEN) 200 UNIT/ML KwikPen INJECT 200 UNITS EVERY 2-3 DAYS 18 mL 3   Blood Glucose  Monitoring Suppl (ONETOUCH VERIO) w/Device KIT Use 1 kit as directed to monitor blood glucose up to 6x daily (Patient not taking: Reported on 09/13/2023) 1 kit 1   glucose blood (FREESTYLE LITE) test strip Use to check blood sugar 6x per day (Patient not taking: Reported on 09/13/2023) 600 strip 1   glucose blood test strip Use as directed to monitor blood glucose up to 6x daily (Patient not taking: Reported on 09/13/2023) 200 each 11   Insulin  Glargine (BASAGLAR  KWIKPEN) 100 UNIT/ML Inject up to 50 units per day (Patient not taking: Reported on 09/13/2023) 45 mL 1   insulin  lispro (HUMALOG  KWIKPEN) 100 UNIT/ML KwikPen Inject up to 50 units per day (Patient not taking: Reported on 09/13/2023) 45 mL 3   insulin  lispro (HUMALOG ) 100 UNIT/ML injection 200 UNITS OF INSULIN  IN PUMP EVERY 48 HOURS (Patient not taking: Reported on 09/13/2023) 40 mL 5   Insulin  Pen Needle (INSUPEN PEN NEEDLES) 32G X 4 MM MISC Inject insulin  via insulin  pen 6 x daily (Patient not taking: Reported on 09/13/2023) 600 each 1   OneTouch Delica Lancets 33G MISC Use as directed to monitor blood glucose up to 6x daily (Patient not taking: Reported on 09/13/2023) 200 each 11   No current facility-administered medications on file prior to visit.   Allergies: No Known Allergies  Surgical History: Past Surgical History:  Procedure Laterality Date   APPENDECTOMY     LAPAROSCOPIC APPENDECTOMY N/A 12/16/2013   Procedure: APPENDECTOMY LAPAROSCOPIC;  Surgeon: CHRISTELLA. Julietta Millman, MD;  Location: [facility] OR;  Service: Pediatrics;  Laterality: N/A;   Family History:  Family History  Problem Relation Age of Onset   Kidney disease Paternal Grandmother    Kidney disease Paternal Grandfather    Hypertension Maternal Grandmother    Thyroid  disease Maternal Grandfather    Healthy Mother    No strong family history of T2DM; only known family member with T2DM is paternal great grandmother.  No family history of T1DM.  MGF has hyperthyroidism, treated  with oral medication (has never had surgery).  No other autoimmune diseases in the family.  Social History: Social History   Social History Narrative   Lives with Dad, and Mom attends [employer]  (Junior - 24-25)     Physical Exam:  Vitals:   09/13/23 0832  BP: 122/80  Pulse: 80  Weight: 219 lb (99.3 kg)  Height: 5' 10.04 (1.779 m)   Body mass index: body mass index is 31.39 kg/m. Growth %ile SmartLinks can only be used for patients less than 62 years old.   Wt Readings from Last 3 Encounters:  09/13/23 219 lb (99.3 kg)  06/02/23 211 lb 3.2 oz (95.8 kg)  08/26/22  212 lb 3.2 oz (96.3 kg)   Ht Readings from Last 3 Encounters:  09/13/23 5' 10.04 (1.779 m)  08/26/22 5' 10.2 (1.783 m)  04/07/22 5' 10.04 (1.779 m) (56%, Z= 0.15)*   * Growth percentiles are based on CDC (Boys, 2-20 Years) data.   Body mass index is 31.39 kg/m.  Facility age limit for growth %iles is 20 years. Facility age limit for growth %iles is 20 years.   General: Well developed, well nourished male in no acute distress.  Appears stated age Head: Normocephalic, atraumatic.   Eyes:  Sclera white.  No eye drainage.   Ears/Nose/Mouth/Throat: Nares patent, no nasal drainage.   Neck: supple, no cervical lymphadenopathy, no thyromegaly Cardiovascular: regular rate, normal S1/S2, no murmurs Respiratory: No increased work of breathing.  Lungs clear to auscultation bilaterally.  No wheezes. Extremities: well perfused. Normal muscle mass.   Skin: warm, dry.  Healing prior CGM/pump sites on abd Neurologic: alert and oriented, normal speech, no tremor   Laboratory Evaluation: Results for orders placed or performed in visit on 09/13/23  POCT Glucose (Device for Home Use)   Collection Time: 09/13/23  8:39 AM  Result Value Ref Range   Glucose Fasting, POC 290 (A) 70 - 99 mg/dL   POC Glucose    POCT glycosylated hemoglobin (Hb A1C)   Collection Time: 09/13/23  8:44 AM  Result Value Ref  Range   Hemoglobin A1C     HbA1c POC (<> result, manual entry)     HbA1c, POC (prediabetic range)     HbA1c, POC (controlled diabetic range) 8.0 (A) 0.0 - 7.0 %    Last A1c 5.1% in 10/2015--> 5.4% in 12/2015-->5.7%-->6.0%-->6.8% 10/28/16-->8.2% 01/2017-->7.8% in 03/2017-->7.6% 08/2017-->8.8% in 11/2017-->8.6% 02/2018-->9.2% 06/2018--> 8.4% 09/2018--> 7.8% 03/2019--> 8.4% 07/2019--> 8.6% 10/2019--> 8% 03/2020--> 8.6% 07/2020--> 7.4% 10/2020--> 7.8% 09/24/21--> 7% 03/2022--> 8.6% 07/2022--> 8% 05/2023--> 8% 08/2023  Assessment/Plan: Early Steel is a 23 y.o. male with T1DM on a pump (Omnipod 5 using U-200) and CGM regimen.   A1c is  unchanged from last visit  The ADA goal for A1c is <7.0%.   Dexcom tracing shows he is not meeting goal of TIR >70%.  he needs a stronger carb ratio at meals  When a patient is on insulin , intensive monitoring of blood glucose levels and continuous insulin  titration is vital to avoid insulin  toxicity leading to severe hypoglycemia. Severe hypoglycemia can lead to seizure or death. Hyperglycemia can also result from inadequate insulin  dosing and can lead to ketosis requiring ICU admission and intravenous insulin .   1. Type 1 diabetes with hyperglycemia -POC glucose and A1c as above, -CGM download reviewed extensively (see interpretation above), --Encouraged to wear med alert ID every day, -Encouraged to rotate injection sites, -Provided with my contact information and advised to email/send mychart with questions/need for BG review, and Reviewed annual DM screening labs. -DMV paperwork completed.  Copy provided to pt and will fax to DMV  2. Insulin  Pump Titration -Made the following pump changes:  Pump Settings: Humalog  U-200 in omnipod 5 pump  Basal Rates: (humalog  U-200) 12AM 0.8  8AM 0.9  12PM 0.9  2PM 0.8  6PM 0.9  Total: 20.4 units daily   Insulin  to carbohydrate ratio  (humalog  U-200) 12AM 14  6AM 10-->9  10AM 12-->11  2PM 14-->13    Insulin  Sensitivity  Factor  (humalog  U-200) 12AM 50   8AM 50  11PM 50              Target Blood  Glucose  (humalog  U-200) 12AM 110                  Active insulin  time 3 hours  If your pump breaks: Back-up lantus dose 35 units every 24 hours Humalog  U-200 1 unit for every 12 carbs (carb ratio)  Humalog  U-200 correction 1 unit for every 50 above 150mg /dl (200mg /dl at bedtime)   Please call [phone] with questions   Follow-up:   Return in about 3 months (around 12/12/2023).   48 minutes spent today reviewing the medical chart, counseling the patient/family, and documenting today's encounter (this does not include time spent on my personal interpretation of CGM).    Rosina Pricilla Palin, MD

## 2023-09-14 ENCOUNTER — Encounter (INDEPENDENT_AMBULATORY_CARE_PROVIDER_SITE_OTHER): Payer: Self-pay

## 2023-09-23 ENCOUNTER — Telehealth (INDEPENDENT_AMBULATORY_CARE_PROVIDER_SITE_OTHER): Payer: Self-pay | Admitting: Pediatrics

## 2023-09-23 NOTE — Telephone Encounter (Signed)
Pa started

## 2023-09-23 NOTE — Telephone Encounter (Signed)
Who's calling (name and relationship to patient) : Luke [name]; self  Best contact number: [phone]  Provider they see: Dr. Larinda Buttery   Reason for call: Luke [name] tried to fill Rx, and was not able to. He stated the pharmacy wouldn't let him fill it until March. He stated that he is almost out. Omnipod. He is requested a call back.    Call ID:      PRESCRIPTION REFILL ONLY  Name of prescription:  Pharmacy:

## 2023-09-23 NOTE — Telephone Encounter (Signed)
  Pt aware of approval faxed to pharmacy

## 2023-12-13 ENCOUNTER — Other Ambulatory Visit (INDEPENDENT_AMBULATORY_CARE_PROVIDER_SITE_OTHER): Payer: Self-pay | Admitting: Pediatrics

## 2023-12-13 ENCOUNTER — Telehealth (INDEPENDENT_AMBULATORY_CARE_PROVIDER_SITE_OTHER): Payer: Self-pay | Admitting: Pediatrics

## 2023-12-13 DIAGNOSIS — E1065 Type 1 diabetes mellitus with hyperglycemia: Secondary | ICD-10-CM

## 2023-12-13 NOTE — Telephone Encounter (Signed)
Rx already sent in

## 2023-12-13 NOTE — Telephone Encounter (Signed)
  Name of who is calling: Leslye Rast Relationship to Patient: Self  Best contact number: [phone]  Provider they see: Dr.Jessup  Reason for call: Luke [name] is calling to get a refill on prescription      PRESCRIPTION REFILL ONLY  Name of prescription: Humalog  [facility] [place]

## 2023-12-20 ENCOUNTER — Encounter (INDEPENDENT_AMBULATORY_CARE_PROVIDER_SITE_OTHER): Payer: Self-pay

## 2023-12-20 ENCOUNTER — Ambulatory Visit (INDEPENDENT_AMBULATORY_CARE_PROVIDER_SITE_OTHER): Payer: 59 | Admitting: Pediatrics

## 2023-12-20 NOTE — Progress Notes (Deleted)
 Pediatric Endocrinology Consultation Follow-up Visit  Chief Complaint: Type 1 diabetes  HPI: Luke [name] is a 23 y.o. male presenting for follow-up of the above concerns.  he attended this visit ***alone.  1. Pilot was initially referred to [facility] in 05/2015 for concerns of new onset diabetes.  He was seen by his PCP on 06/10/2015 for a well child check.  At that visit, blood work was obtained due to him being overweight including a random blood sugar that was elevated to 297 (this was obtained 3-4 hours after eating a cheeseburger, potato wedges, apple slices, chocolate milk, a can of pineapple soda, and a cupcake with frosting).  A lipid panel was also obtained 06/10/15 showing total cholesterol 199, HDL 31, LDL 109.  He was called back to PCP's office on 06/11/2015 for fasting blood work (fingerstick glucose 155, 146 on CMP), UA (negative for ketones and glucose), A1c 7.7%.  CMP normal except elevated glucose (including normal BUN /Cr and AST/ALT).  TSH slightly elevated at 5.349 (0.4-5) with normal FT4 of 0.93.  He was initially seen at [facility] on 06/13/15 where A1c was 7.4%, GAD Ab and insulin  Ab were positive and C-peptide was normal and he was diagnosed with evolving T1DM. He was started on metformin  at that visit prior to Ab results.  Metformin  was discontinued in 07/2015 after several blood sugars were in the 60s.  He also had normal TFTs and celiac screen in 07/2015.  A1c gradually increased and blood sugars started becoming elevated so he was started on rapid-acting insulin  in 10/2016 and started on long-acting insulin  within the next week.  He started on an omnipod pump in 02/2017.  He started on a dexcom CGM in 07/2018.   He transitioned to omnipod 5 in 11/2021.  2. Since last visit on 09/13/23, he has been ***well.   ED visits/Hospitalizations for diabetes: None***  Concerns:  -***  Insulin  regimen: Humalog  U-200 in omnipod 5 pump Pump Settings: Humalog  U-200 in omnipod 5 pump***  Basal  Rates: (humalog  U-200) 12AM 0.8  8AM 0.9  12PM 0.9  2PM 0.8  6PM 0.9  Total: 20.4 units daily   Insulin  to carbohydrate ratio  (humalog  U-200) 12AM 14  6AM 9  10AM 11  2PM 13    Insulin  Sensitivity Factor  (humalog  U-200) 12AM 50   8AM 50  11PM 50              Target Blood Glucose  (humalog  U-200) 12AM 110                  Active insulin  time 3 hours    BG/Pump/CGM download:   ***    Interpretation: ***   Hypoglycemia:  ***Able to feel lows.  No glucagon  needed recently.  Wearing Med-alert ID currently: ***Wearing intermittently. Reminded to wear one at all times.  Injection sites: abdominal wall and arm(s)*** Annual labs due: Drawn 09/09/23; labs unremarkable Eye exam:  Due now.  Reminded to schedule. ***   ROS: All systems reviewed with pertinent positives listed below; otherwise negative.    Past Medical History:  Past Medical History:  Diagnosis Date   Diabetes mellitus without complication (HCC)    Elevated hemoglobin A1c    05/2015 A1c 7.7% with fasting hyperglycemia.  + GAD ab and + insulin  Ab   Meds: Current Outpatient Medications on File Prior to Visit  Medication Sig Dispense Refill   Blood Glucose Monitoring Suppl (ONETOUCH VERIO) w/Device KIT Use 1 kit as directed to monitor  blood glucose up to 6x daily (Patient not taking: Reported on 09/13/2023) 1 kit 1   Continuous Glucose Sensor (DEXCOM G6 SENSOR) MISC CHANGE SENSOR EVERY 10 DAYS 3 each 11   Continuous Glucose Transmitter (DEXCOM G6 TRANSMITTER) MISC Use with Dexcom sensor, reuse for 3 months 1 each 1   Glucagon  (BAQSIMI  TWO PACK) 3 MG/DOSE POWD Place 1 application  into the nose as needed. Use as directed if unconscious, unable to take food po, or having a seizure due to hypoglycemia 2 each 1   glucose blood (FREESTYLE LITE) test strip Use to check blood sugar 6x per day (Patient not taking: Reported on 09/13/2023) 600 strip 1   glucose blood test strip Use as directed to monitor blood  glucose up to 6x daily (Patient not taking: Reported on 09/13/2023) 200 each 11   Insulin  Disposable Pump (OMNIPOD 5 G6 PODS, GEN 5,) MISC Inject 1 Device into the skin as directed. Change pod every 3 days. Patient will need 30 pods for a 90 day supply. Please fill for [facility] [id number]. 30 each 3   Insulin  Glargine (BASAGLAR  KWIKPEN) 100 UNIT/ML Inject up to 50 units per day (Patient not taking: Reported on 09/13/2023) 45 mL 1   insulin  lispro (HUMALOG  KWIKPEN) 100 UNIT/ML KwikPen Inject up to 50 units per day (Patient not taking: Reported on 09/13/2023) 45 mL 3   insulin  lispro (HUMALOG  KWIKPEN) 200 UNIT/ML KwikPen INJECT 200 UNITS SUBCUTANEOUSLY EVERY 2-3 DAYS 18 mL 2   insulin  lispro (HUMALOG ) 100 UNIT/ML injection 200 UNITS OF INSULIN  IN PUMP EVERY 48 HOURS (Patient not taking: Reported on 09/13/2023) 40 mL 5   Insulin  Pen Needle (INSUPEN PEN NEEDLES) 32G X 4 MM MISC Inject insulin  via insulin  pen 6 x daily (Patient not taking: Reported on 09/13/2023) 600 each 1   OneTouch Delica Lancets 33G MISC Use as directed to monitor blood glucose up to 6x daily (Patient not taking: Reported on 09/13/2023) 200 each 11   No current facility-administered medications on file prior to visit.   Allergies: No Known Allergies  Surgical History: Past Surgical History:  Procedure Laterality Date   APPENDECTOMY     LAPAROSCOPIC APPENDECTOMY N/A 12/16/2013   Procedure: APPENDECTOMY LAPAROSCOPIC;  Surgeon: Melven Stable. Alanda Allegra, MD;  Location: [facility] OR;  Service: Pediatrics;  Laterality: N/A;   Family History:  Family History  Problem Relation Age of Onset   Kidney disease Paternal Grandmother    Kidney disease Paternal Grandfather    Hypertension Maternal Grandmother    Thyroid  disease Maternal Grandfather    Healthy Mother    No strong family history of T2DM; only known family member with T2DM is paternal great grandmother.  No family history of T1DM.  MGF has hyperthyroidism, treated with oral medication (has  never had surgery).  No other autoimmune diseases in the family.  Social History: Social History   Social History Narrative   Lives with Dad, and Mom attends Therapist, sports for [employer]  (Junior - 24-25)     Physical Exam:  There were no vitals filed for this visit.  Body mass index: body mass index is unknown because there is no height or weight on file. Growth %ile SmartLinks can only be used for patients less than 21 years old.   Wt Readings from Last 3 Encounters:  09/13/23 219 lb (99.3 kg)  06/02/23 211 lb 3.2 oz (95.8 kg)  08/26/22 212 lb 3.2 oz (96.3 kg)   Ht Readings from Last 3 Encounters:  09/13/23 5'  10.04" (1.779 m)  08/26/22 5' 10.2" (1.783 m)  04/07/22 5' 10.04" (1.779 m) (56%, Z= 0.15)*   * Growth percentiles are based on CDC (Boys, 2-20 Years) data.   There is no height or weight on file to calculate BMI.  Facility age limit for growth %iles is 20 years. Facility age limit for growth %iles is 20 years.   General: Well developed, well nourished male in no acute distress.  Appears *** stated age Head: Normocephalic, atraumatic.   Eyes:  Pupils equal and round. EOMI.   Sclera white.  No eye drainage.   Ears/Nose/Mouth/Throat: Nares patent, no nasal drainage.  Moist mucous membranes, normal dentition Neck: supple, no cervical lymphadenopathy, no thyromegaly Cardiovascular: regular rate, normal S1/S2, no murmurs Respiratory: No increased work of breathing.  Lungs clear to auscultation bilaterally.  No wheezes. Abdomen: soft, nontender, nondistended.  Extremities: warm, well perfused, cap refill < 2 sec.   Musculoskeletal: Normal muscle mass.  Normal strength Skin: warm, dry.  No rash or lesions. Neurologic: alert and oriented, normal speech, no tremor   Laboratory Evaluation: Results for orders placed or performed in visit on 09/13/23  POCT Glucose (Device for Home Use)   Collection Time: 09/13/23  8:39 AM  Result Value Ref Range   Glucose Fasting, POC 290  (A) 70 - 99 mg/dL   POC Glucose    POCT glycosylated hemoglobin (Hb A1C)   Collection Time: 09/13/23  8:44 AM  Result Value Ref Range   Hemoglobin A1C     HbA1c POC (<> result, manual entry)     HbA1c, POC (prediabetic range)     HbA1c, POC (controlled diabetic range) 8.0 (A) 0.0 - 7.0 %    Last A1c 5.1% in 10/2015--> 5.4% in 12/2015-->5.7%-->6.0%-->6.8% 10/28/16-->8.2% 01/2017-->7.8% in 03/2017-->7.6% 08/2017-->8.8% in 11/2017-->8.6% 02/2018-->9.2% 06/2018--> 8.4% 09/2018--> 7.8% 03/2019--> 8.4% 07/2019--> 8.6% 10/2019--> 8% 03/2020--> 8.6% 07/2020--> 7.4% 10/2020--> 7.8% 09/24/21--> 7% 03/2022--> 8.6% 07/2022--> 8% 05/2023--> 8% 08/2023--> ***% 11/2023  Assessment/Plan: Luke [name] is a 23 y.o. male with T1DM on a pump (Omnipod 5 using U-200) and CGM regimen.   A1c is {higher/lower:18993} than last visit  The ADA goal for A1c is <7.0%.   Dexcom tracing shows he {ACTION; IS/IS ZOX:[id number]} meeting goal of TIR >70%.  he needs {abjmoreinsulin:29737}  When a patient is on insulin , intensive monitoring of blood glucose levels and continuous insulin  titration is vital to avoid insulin  toxicity leading to severe hypoglycemia. Severe hypoglycemia can lead to seizure or death. Hyperglycemia can also result from inadequate insulin  dosing and can lead to ketosis requiring ICU admission and intravenous insulin .   1. Type 1 diabetes with hyperglycemia {abjT1DMplan:29739::"-POC glucose and A1c as above","-CGM download reviewed extensively (see interpretation above)","--Encouraged to wear med alert ID every day","-Encouraged to rotate injection sites","-Provided with my contact information and advised to email/send mychart with questions/need for BG review"}  2. Insulin  Pump {abjinsulinpump in place:29738} -Made the following pump changes: *** Pump Settings: Humalog  U-200 in omnipod 5 pump  Basal Rates: (humalog  U-200) 12AM 0.8  8AM 0.9  12PM 0.9  2PM 0.8  6PM 0.9  Total: 20.4 units daily   Insulin  to  carbohydrate ratio  (humalog  U-200) 12AM 14  6AM 10-->9  10AM 12-->11  2PM 14-->13    Insulin  Sensitivity Factor  (humalog  U-200) 12AM 50   8AM 50  11PM 50              Target Blood Glucose  (humalog  U-200) 12AM 110  Active insulin  time 3 hours  If your pump breaks: Back-up lantus dose 35 units every 24 hours Humalog  U-200 1 unit for every 12 carbs (carb ratio)  Humalog  U-200 correction 1 unit for every 50 above 150mg /dl (200mg /dl at bedtime)   Please call [phone] with questions   Follow-up:   No follow-ups on file.   ***  Lavada Porteous, MD

## 2024-01-03 ENCOUNTER — Encounter (INDEPENDENT_AMBULATORY_CARE_PROVIDER_SITE_OTHER): Payer: Self-pay | Admitting: Pediatrics

## 2024-01-03 ENCOUNTER — Ambulatory Visit (INDEPENDENT_AMBULATORY_CARE_PROVIDER_SITE_OTHER): Admitting: Pediatrics

## 2024-01-03 VITALS — BP 118/70 | HR 86 | Ht 70.08 in | Wt 222.2 lb

## 2024-01-03 DIAGNOSIS — Z4681 Encounter for fitting and adjustment of insulin pump: Secondary | ICD-10-CM

## 2024-01-03 DIAGNOSIS — E1065 Type 1 diabetes mellitus with hyperglycemia: Secondary | ICD-10-CM

## 2024-01-03 LAB — POCT GLUCOSE (DEVICE FOR HOME USE): Glucose Fasting, POC: 143 mg/dL — AB (ref 70–99)

## 2024-01-03 LAB — POCT GLYCOSYLATED HEMOGLOBIN (HGB A1C): HbA1c, POC (controlled diabetic range): 7.7 % — AB (ref 0.0–7.0)

## 2024-01-03 MED ORDER — OMNIPOD 5 DEXG7G6 PODS GEN 5 MISC
11 refills | Status: AC
Start: 2024-01-03 — End: ?

## 2024-01-03 NOTE — Patient Instructions (Signed)
 It was a pleasure to see you in clinic today.   Feel free to contact our office during normal business hours at [phone] with questions or concerns. If you have an emergency after normal business hours, please call the above number to reach our answering service who will contact the on-call pediatric endocrinologist.  If you choose to communicate with [place] via MyChart, please do not send urgent messages as this inbox is NOT monitored on nights or weekends.  Urgent concerns should be discussed with the on-call pediatric endocrinologist.  -Always have fast sugar with you in case of low blood sugar (glucose tabs, regular juice or soda, candy) -Always wear your ID that states you have diabetes -Always bring your meter/continuous glucose monitor to your visit  Hypoglycemia  Shaking or trembling. Sweating and chills. Dizziness or lightheadedness. Faster heart rate. Headaches. Hunger. Nausea. Nervousness or irritability. Pale skin. Restless sleep. Weakness. Blurry vision. Confusion or trouble concentrating. Sleepiness. Slurred speech. Tingling or numbness in the face or mouth.  How do I treat an episode of hypoglycemia? The American Diabetes Association recommends the "15-15 rule" for an episode of hypoglycemia: Eat or drink 15 grams of fast-acting carbs (4oz regular soda or juice, 1 pkg fruit snacks, 4 glucose tabs) to raise your blood sugar. After 15 minutes, check your blood sugar. If it's still below 80 mg/dL, have another 15 grams of fast-acting carbs. Repeat until your blood sugar is at least 80 mg/dL.  Hyperglycemia  Frequent urination Increased thirst Blurred vision Fatigue Headache          Diabetic Ketoacidosis (DKA)  If hyperglycemia goes untreated, it can cause toxic acids (ketones) to build up in your blood and urine (ketoacidosis). Signs and symptoms include: Fruity-smelling breath Nausea and vomiting Shortness of breath Dry  mouth Weakness Confusion Coma Abdominal pain  Sick day/Ketones Protocol  Check blood glucose every 3 hours  Give rapid acting insulin correction dose           every 3 hours until ketones are negative Check urine ketones every 2 hours (until ketones are negative)  Drink plenty of fluids (water, Pedialyte) every hour Notify clinic of sickness/ketones  If you develop signs of DKA (especially vomiting or abdominal pain and inability to drink), go to [facility] room immediately.   Hemoglobin A1c levels

## 2024-01-03 NOTE — Progress Notes (Signed)
 Pediatric Endocrinology Consultation Follow-up Visit  Chief Complaint: Type 1 diabetes  HPI: Luke [name] is a 23 y.o. male presenting for follow-up of the above concerns.  he attended this visit alone.  1. Lindsay was initially referred to [facility] in 05/2015 for concerns of new onset diabetes.  He was seen by his PCP on 06/10/2015 for a well child check.  At that visit, blood work was obtained due to him being overweight including a random blood sugar that was elevated to 297 (this was obtained 3-4 hours after eating a cheeseburger, potato wedges, apple slices, chocolate milk, a can of pineapple soda, and a cupcake with frosting).  A lipid panel was also obtained 06/10/15 showing total cholesterol 199, HDL 31, LDL 109.  He was called back to PCP's office on 06/11/2015 for fasting blood work (fingerstick glucose 155, 146 on CMP), UA (negative for ketones and glucose), A1c 7.7%.  CMP normal except elevated glucose (including normal BUN /Cr and AST/ALT).  TSH slightly elevated at 5.349 (0.4-5) with normal FT4 of 0.93.  He was initially seen at [facility] on 06/13/15 where A1c was 7.4%, GAD Ab and insulin  Ab were positive and C-peptide was normal and he was diagnosed with evolving T1DM. He was started on metformin  at that visit prior to Ab results.  Metformin  was discontinued in 07/2015 after several blood sugars were in the 60s.  He also had normal TFTs and celiac screen in 07/2015.  A1c gradually increased and blood sugars started becoming elevated so he was started on rapid-acting insulin  in 10/2016 and started on long-acting insulin  within the next week.  He started on an omnipod pump in 02/2017.  He started on a dexcom CGM in 07/2018.   He transitioned to omnipod 5 in 11/2021.  2. Since last visit on 09/13/23, he has been well.   ED visits/Hospitalizations for diabetes: None  Concerns:  -None.  Things are going well.  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  He is considering [place]  schools in the [place] or [place]. -Needs more omnipod 5 pods.  Insulin  regimen: Humalog  U-200 in omnipod 5 pump Pump Settings: Humalog  U-200 in omnipod 5 pump  Basal Rates: (humalog  U-200) 12AM 0.8  8AM 0.9  12PM 0.9  2PM 0.8  6PM 0.9  Total: 20.4 units daily   Insulin  to carbohydrate ratio  (humalog  U-200) 12AM 14  6AM 9  10AM 11  2PM 13    Insulin  Sensitivity Factor  (humalog  U-200) 12AM 50   8AM 50  11PM 50              Target Blood Glucose  (humalog  U-200) 12AM 110                  Active insulin  time 3 hours    BG/Pump/CGM download:     Interpretation: Needs more for carbs and need to bolus for all carbs.   Hypoglycemia:  Able to feel lows.  No glucagon  needed recently.  Wearing Med-alert ID currently: Not wearing today. Reminded to wear one at all times.  Injection sites: abdominal wall and arm(s), legs.  Tried back but didn't like it. Annual labs due: Drawn 09/09/23; labs unremarkable Eye exam:  Due now.  Reminded to schedule.    ROS: All systems reviewed with pertinent positives listed below; otherwise negative.    Past Medical History:  Past Medical History:  Diagnosis Date   Diabetes mellitus without complication (HCC)    Elevated hemoglobin  A1c    05/2015 A1c 7.7% with fasting hyperglycemia.  + GAD ab and + insulin  Ab   Meds: Current Outpatient Medications on File Prior to Visit  Medication Sig Dispense Refill   Continuous Glucose Sensor (DEXCOM G6 SENSOR) MISC CHANGE SENSOR EVERY 10 DAYS 3 each 11   Continuous Glucose Transmitter (DEXCOM G6 TRANSMITTER) MISC Use with Dexcom sensor, reuse for 3 months 1 each 1   Glucagon  (BAQSIMI  TWO PACK) 3 MG/DOSE POWD Place 1 application  into the nose as needed. Use as directed if unconscious, unable to take food po, or having a seizure due to hypoglycemia 2 each 1   insulin  lispro (HUMALOG  KWIKPEN) 200 UNIT/ML KwikPen INJECT 200 UNITS SUBCUTANEOUSLY EVERY 2-3 DAYS 18 mL 2   Blood Glucose Monitoring Suppl  (ONETOUCH VERIO) w/Device KIT Use 1 kit as directed to monitor blood glucose up to 6x daily (Patient not taking: Reported on 01/03/2024) 1 kit 1   glucose blood (FREESTYLE LITE) test strip Use to check blood sugar 6x per day (Patient not taking: Reported on 08/26/2022) 600 strip 1   glucose blood test strip Use as directed to monitor blood glucose up to 6x daily (Patient not taking: Reported on 01/03/2024) 200 each 11   Insulin  Glargine (BASAGLAR  KWIKPEN) 100 UNIT/ML Inject up to 50 units per day (Patient not taking: Reported on 08/26/2022) 45 mL 1   insulin  lispro (HUMALOG  KWIKPEN) 100 UNIT/ML KwikPen Inject up to 50 units per day (Patient not taking: Reported on 08/26/2022) 45 mL 3   insulin  lispro (HUMALOG ) 100 UNIT/ML injection 200 UNITS OF INSULIN  IN PUMP EVERY 48 HOURS (Patient not taking: Reported on 01/03/2024) 40 mL 5   Insulin  Pen Needle (INSUPEN PEN NEEDLES) 32G X 4 MM MISC Inject insulin  via insulin  pen 6 x daily (Patient not taking: Reported on 06/02/2023) 600 each 1   OneTouch Delica Lancets 33G MISC Use as directed to monitor blood glucose up to 6x daily (Patient not taking: Reported on 01/03/2024) 200 each 11   No current facility-administered medications on file prior to visit.   Allergies: No Known Allergies  Surgical History: Past Surgical History:  Procedure Laterality Date   APPENDECTOMY     LAPAROSCOPIC APPENDECTOMY N/A 12/16/2013   Procedure: APPENDECTOMY LAPAROSCOPIC;  Surgeon: Melven Stable. Alanda Allegra, MD;  Location: [facility] OR;  Service: Pediatrics;  Laterality: N/A;   Family History:  Family History  Problem Relation Age of Onset   Kidney disease Paternal Grandmother    Kidney disease Paternal Grandfather    Hypertension Maternal Grandmother    Thyroid  disease Maternal Grandfather    Healthy Mother    No strong family history of T2DM; only known family member with T2DM is paternal great grandmother.  No family history of T1DM.  MGF has hyperthyroidism, treated with oral medication  (has never had surgery).  No other autoimmune diseases in the family.  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 Growth %ile SmartLinks can only be used for patients less than 37 years old.   Wt Readings from Last 3 Encounters:  01/03/24 222 lb 3.2 oz (100.8 kg)  09/13/23  219 lb (99.3 kg)  06/02/23 211 lb 3.2 oz (95.8 kg)   Ht Readings from Last 3 Encounters:  01/03/24 5' 10.08" (1.78 m)  09/13/23 5' 10.04" (1.779 m)  08/26/22 5' 10.2" (1.783 m)   Body mass index is 31.81 kg/m.  Facility age limit for growth %iles is 20 years. Facility age limit for growth %iles is 20 years.   General: Well developed, well nourished male in no acute distress.  Appears  stated age Head: Normocephalic, atraumatic.   Eyes:  Pupils equal and round. EOMI.   Sclera white.  No eye drainage.   Ears/Nose/Mouth/Throat: Nares patent, no nasal drainage.  Moist mucous membranes, normal dentition Neck: supple, no cervical lymphadenopathy, no thyromegaly Cardiovascular: regular rate, normal S1/S2, no murmurs Respiratory: No increased work of breathing.  Lungs clear to auscultation bilaterally.  No wheezes. Extremities: warm, well perfused, cap refill < 2 sec.   Musculoskeletal: Normal muscle mass.  Normal strength Skin: warm, dry.  No rash or lesions. Pod on L arm Neurologic: alert and oriented, normal speech, no tremor   Laboratory Evaluation: Results for orders placed or performed in visit on 01/03/24  POCT Glucose (Device for Home Use)   Collection Time: 01/03/24  9:47 AM  Result Value Ref Range   Glucose Fasting, POC 143 (A) 70 - 99 mg/dL   POC Glucose    POCT glycosylated  hemoglobin (Hb A1C)   Collection Time: 01/03/24  9:53 AM  Result Value Ref Range   Hemoglobin A1C     HbA1c POC (<> result, manual entry)     HbA1c, POC (prediabetic range)     HbA1c, POC (controlled diabetic range) 7.7 (A) 0.0 - 7.0 %    Last A1c 5.1% in 10/2015--> 5.4% in 12/2015-->5.7%-->6.0%-->6.8% 10/28/16-->8.2% 01/2017-->7.8% in 03/2017-->7.6% 08/2017-->8.8% in 11/2017-->8.6% 02/2018-->9.2% 06/2018--> 8.4% 09/2018--> 7.8% 03/2019--> 8.4% 07/2019--> 8.6% 10/2019--> 8% 03/2020--> 8.6% 07/2020--> 7.4% 10/2020--> 7.8% 09/24/21--> 7% 03/2022--> 8.6% 07/2022--> 8% 05/2023--> 8% 08/2023--> 7.7% 11/2023  Assessment/Plan: Daved Bauernfeind is a 23 y.o. male with T1DM on a pump (Omnipod 5 using U-200) and CGM regimen.   A1c is lower than last visit  The ADA goal for A1c is <7.0%.   Dexcom tracing shows he is not meeting goal of TIR >70%.  he needs a stronger carb ratio at meals and needs to remember to bolus for all carbs.  When a patient is on insulin , intensive monitoring of blood glucose levels and continuous insulin  titration is vital to avoid insulin  toxicity leading to severe hypoglycemia. Severe hypoglycemia can lead to seizure or death. Hyperglycemia can also result from inadequate insulin  dosing and can lead to ketosis requiring ICU admission and intravenous insulin .   1. Type 1 diabetes with hyperglycemia -POC glucose and A1c as above, -CGM download reviewed extensively (see interpretation above), --Encouraged to wear med alert ID every day, -Encouraged to rotate injection sites, and -Provided with my contact information and advised to email/send mychart with questions/need for BG review Rx sent to pharmacy including omnipod 5 pods  2. Insulin  Pump Titration -Made the following pump changes: Humalog  U-200 in omnipod 5 pump  Basal Rates: (humalog  U-200) 12AM 0.8  8AM 0.9  12PM 0.9  2PM 0.8  6PM 0.9  Total: 20.4 units daily   Insulin  to carbohydrate ratio  (humalog  U-200) 12AM 14  6AM 9  10AM  11-->10  2PM 13-->12    Insulin  Sensitivity Factor  (humalog  U-200) 12AM 50   8AM 50  11PM 50  Target Blood Glucose  (humalog  U-200) 12AM 110                  Active insulin  time 3 hours   Follow-up:   Transition to adult endocrine.  Pt will find an office in [place] and contact us  if referral is needed.  55 minutes spent today reviewing the medical chart, counseling the patient/family, and documenting today's encounter (this does not include time spent on my personal interpretation of CGM).   Lavada Porteous, MD

## 2024-01-03 NOTE — Addendum Note (Signed)
 Addended by: Kieth Pelt on: 01/03/2024 11:53 AM   Modules accepted: Level of Service

## 2024-01-04 ENCOUNTER — Telehealth (INDEPENDENT_AMBULATORY_CARE_PROVIDER_SITE_OTHER): Payer: Self-pay | Admitting: Pediatrics

## 2024-01-04 NOTE — Telephone Encounter (Signed)
 Called pharmacy to follow up on refill, pharmacy tech attempted to run and yes it wants 1 per 180, she looked further into it.  Their automated computer is converting it wrong and she will fix it.  After she fixes it she believes it will go through.  Called patient to update, told him to call back in the mornign if he still has issues getting it filled. He verbalized understanding.

## 2024-01-04 NOTE — Telephone Encounter (Signed)
  Name of who is calling: Luke [name] Relationship to Patient: self  Best contact number:[phone]  Provider they see: jessup  Reason for call: pt stated that pharmacy informed him that insurance rejected the Omnipod RX because there is a "Max quantity of 1 in 180 days". He stated that he should be changing it out every other day     PRESCRIPTION REFILL ONLY  Name of prescription:  Pharmacy:

## 2024-01-06 ENCOUNTER — Other Ambulatory Visit (INDEPENDENT_AMBULATORY_CARE_PROVIDER_SITE_OTHER): Payer: Self-pay | Admitting: Pediatrics

## 2024-01-06 DIAGNOSIS — E1065 Type 1 diabetes mellitus with hyperglycemia: Secondary | ICD-10-CM

## 2024-01-16 ENCOUNTER — Telehealth (INDEPENDENT_AMBULATORY_CARE_PROVIDER_SITE_OTHER): Payer: Self-pay | Admitting: Pediatrics

## 2024-01-16 DIAGNOSIS — E1065 Type 1 diabetes mellitus with hyperglycemia: Secondary | ICD-10-CM

## 2024-01-16 MED ORDER — DEXCOM G6 SENSOR MISC: 5 refills | Status: AC

## 2024-01-16 MED ORDER — DEXCOM G6 SENSOR MISC
5 refills | Status: DC
Start: 2024-01-16 — End: 2024-01-16

## 2024-01-16 NOTE — Telephone Encounter (Signed)
 Who's calling (name and relationship to patient) : [name], self  Best contact number: [phone]  Provider they see: Dr. Cleora Daft   Reason for call: Luke [name] called in stating that he was trying to get Rx refill through the pharmacy, but was told that he needed to contact the office. He stated he was out of the [facility] sensors.    Call ID:      PRESCRIPTION REFILL ONLY  Name of prescription:  Pharmacy:

## 2024-01-16 NOTE — Addendum Note (Signed)
 Addended by: Laural Polka A on: 01/16/2024 03:38 PM   Modules accepted: Orders

## 2024-02-08 ENCOUNTER — Telehealth (INDEPENDENT_AMBULATORY_CARE_PROVIDER_SITE_OTHER): Payer: Self-pay | Admitting: Pediatrics

## 2024-02-08 DIAGNOSIS — E1065 Type 1 diabetes mellitus with hyperglycemia: Secondary | ICD-10-CM

## 2024-02-08 MED ORDER — HUMALOG KWIKPEN 200 UNIT/ML ~~LOC~~ SOPN: PEN_INJECTOR | SUBCUTANEOUS | 2 refills | Status: AC

## 2024-02-08 MED ORDER — DEXCOM G6 TRANSMITTER MISC: 0 refills | Status: AC

## 2024-02-08 NOTE — Telephone Encounter (Signed)
 Called Luke [name] back and let him know I am [place] able to [place]. He verbalized understanding and would still like me to send in refills. I also let Luke [name] know that he will need to talk with his PCP about getting a referral to adult endo. He stated he is seeing anew pcp due to his age and ill not able to get in before he leave. I told him I will check with my supervisor to see if we can get a referral placed. Luke [name] need Dexcom G6 transmitter and sensor, Humalog  u200 and omnipods. I let him know he has refills for both Omnipods and dexcom sensors I will send in refills for Humalog  and the transmitter.

## 2024-02-08 NOTE — Telephone Encounter (Signed)
 Who's calling (name and relationship to patient) : Luke [name]; self.   Best contact number: [phone]  Provider they see: Dr. Cleora Daft.   Reason for call: [name] called in wanting to know the next steps on how to get his insulin  refilled, he is not sure if Dr. Cleora Daft place a referral to adult Endo. He is currently out of state until August. He is requesting a call back.   FYI: He was informed due to age, that an adult referral will be sent out.    Call ID:      PRESCRIPTION REFILL ONLY  Name of prescription:  Pharmacy:

## 2024-05-21 ENCOUNTER — Telehealth (INDEPENDENT_AMBULATORY_CARE_PROVIDER_SITE_OTHER): Payer: Self-pay | Admitting: Pediatrics

## 2024-05-21 NOTE — Telephone Encounter (Signed)
 Attempted to return call, no answer left HIPAA approved message to return call

## 2024-05-21 NOTE — Telephone Encounter (Signed)
 Who's calling (name and relationship to patient) : [name];  Self   Best contact number: [phone]  Provider they see: Willo COME   Reason for call: Luke [name] called in stating that Rx's have been fine until recently. He ws told that he needs to renew his Rx for insulin . He stated that he has not been able to find an adult Endo just yet. He wanted to know if he would still be able to get a refill.  He also wanted to know if the office would be able to send an Adult Endo referral out.     Call ID:      PRESCRIPTION REFILL ONLY  Name of prescription:  Pharmacy:

## 2024-05-21 NOTE — Telephone Encounter (Signed)
 Westen returned call and stated he needs insulin  refill, as well as a referral to adult endo. I let him know Dr. Willo is no longer here and he would need to go through his pcp to get refills and a referral to adult endo. I also let him know he can go to urgent care if he needs refills sooner and the pcp can't get to them in time. He verbalized understanding and had no more questions.
